# Patient Record
Sex: Female | Born: 1990 | Race: White | Hispanic: No | Marital: Single | Smoking: Former smoker
Health system: Southern US, Community
[De-identification: ages and names within clinical notes are randomized; demographics above are authoritative.]

## PROBLEM LIST (undated history)

## (undated) DIAGNOSIS — M7070 Other bursitis of hip, unspecified hip: Secondary | ICD-10-CM

## (undated) DIAGNOSIS — F419 Anxiety disorder, unspecified: Secondary | ICD-10-CM

## (undated) DIAGNOSIS — F319 Bipolar disorder, unspecified: Secondary | ICD-10-CM

## (undated) DIAGNOSIS — M549 Dorsalgia, unspecified: Secondary | ICD-10-CM

## (undated) HISTORY — DX: Dorsalgia, unspecified: M54.9

## (undated) HISTORY — PX: DENTAL SURGERY: SHX609

## (undated) HISTORY — PX: TUBAL LIGATION: SHX77

## (undated) HISTORY — DX: Other bursitis of hip, unspecified hip: M70.70

---
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
## 2009-02-08 ENCOUNTER — Observation Stay: Payer: Self-pay

## 2009-02-17 ENCOUNTER — Inpatient Hospital Stay: Payer: Self-pay

## 2009-02-20 ENCOUNTER — Ambulatory Visit: Payer: Self-pay | Admitting: Pediatrics

## 2009-07-12 ENCOUNTER — Ambulatory Visit: Payer: Self-pay | Admitting: Family Medicine

## 2009-12-30 ENCOUNTER — Observation Stay: Payer: Self-pay

## 2010-02-08 ENCOUNTER — Observation Stay: Payer: Self-pay | Admitting: Obstetrics & Gynecology

## 2010-02-16 ENCOUNTER — Observation Stay: Payer: Self-pay | Admitting: Obstetrics and Gynecology

## 2010-02-16 ENCOUNTER — Inpatient Hospital Stay: Payer: Self-pay

## 2010-02-25 ENCOUNTER — Emergency Department: Payer: Self-pay | Admitting: Emergency Medicine

## 2010-08-28 ENCOUNTER — Emergency Department: Payer: Self-pay | Admitting: Emergency Medicine

## 2010-10-16 ENCOUNTER — Emergency Department: Payer: Self-pay | Admitting: Emergency Medicine

## 2010-10-17 ENCOUNTER — Emergency Department: Payer: Self-pay | Admitting: Emergency Medicine

## 2010-12-18 ENCOUNTER — Emergency Department: Payer: Self-pay | Admitting: Emergency Medicine

## 2011-04-18 ENCOUNTER — Emergency Department: Payer: Self-pay | Admitting: Emergency Medicine

## 2011-09-17 ENCOUNTER — Emergency Department: Payer: Self-pay | Admitting: Unknown Physician Specialty

## 2011-09-17 LAB — PREGNANCY, URINE: Pregnancy Test, Urine: NEGATIVE m[IU]/mL

## 2011-11-29 ENCOUNTER — Emergency Department: Payer: Self-pay | Admitting: Emergency Medicine

## 2011-11-29 LAB — CBC
HCT: 41.6 % (ref 35.0–47.0)
MCH: 31.8 pg (ref 26.0–34.0)
MCV: 92 fL (ref 80–100)
Platelet: 236 10*3/uL (ref 150–440)
RBC: 4.55 10*6/uL (ref 3.80–5.20)
RDW: 12.3 % (ref 11.5–14.5)

## 2011-11-29 LAB — COMPREHENSIVE METABOLIC PANEL
Albumin: 4.2 g/dL (ref 3.4–5.0)
Alkaline Phosphatase: 75 U/L (ref 50–136)
BUN: 11 mg/dL (ref 7–18)
Calcium, Total: 9 mg/dL (ref 8.5–10.1)
Chloride: 105 mmol/L (ref 98–107)
Glucose: 82 mg/dL (ref 65–99)
SGOT(AST): 21 U/L (ref 15–37)
SGPT (ALT): 18 U/L (ref 12–78)
Total Protein: 8.3 g/dL — ABNORMAL HIGH (ref 6.4–8.2)

## 2011-11-29 LAB — URINALYSIS, COMPLETE
Bilirubin,UR: NEGATIVE
Ketone: NEGATIVE
Nitrite: NEGATIVE
Protein: 30
Specific Gravity: 1.028 (ref 1.003–1.030)
WBC UR: 3 /HPF (ref 0–5)

## 2011-11-29 LAB — WET PREP, GENITAL

## 2011-11-29 LAB — LIPASE, BLOOD: Lipase: 107 U/L (ref 73–393)

## 2011-12-25 ENCOUNTER — Emergency Department: Payer: Self-pay | Admitting: Emergency Medicine

## 2012-01-08 ENCOUNTER — Emergency Department: Payer: Self-pay | Admitting: Emergency Medicine

## 2012-01-08 LAB — URINALYSIS, COMPLETE
Bacteria: NONE SEEN
Bilirubin,UR: NEGATIVE
Glucose,UR: NEGATIVE mg/dL (ref 0–75)
Hyaline Cast: 7
Ketone: NEGATIVE
Ph: 6 (ref 4.5–8.0)
Protein: 100
RBC,UR: 483 /HPF (ref 0–5)
Specific Gravity: 1.025 (ref 1.003–1.030)
WBC UR: 1548 /HPF (ref 0–5)

## 2012-01-20 ENCOUNTER — Emergency Department: Payer: Self-pay | Admitting: Emergency Medicine

## 2012-02-24 ENCOUNTER — Emergency Department: Payer: Self-pay | Admitting: Emergency Medicine

## 2012-02-24 LAB — CBC
HCT: 40.5 % (ref 35.0–47.0)
HGB: 13.9 g/dL (ref 12.0–16.0)
MCHC: 34.4 g/dL (ref 32.0–36.0)
Platelet: 225 10*3/uL (ref 150–440)
RBC: 4.49 10*6/uL (ref 3.80–5.20)
WBC: 7.2 10*3/uL (ref 3.6–11.0)

## 2012-02-24 LAB — BASIC METABOLIC PANEL
Anion Gap: 5 — ABNORMAL LOW (ref 7–16)
BUN: 5 mg/dL — ABNORMAL LOW (ref 7–18)
Chloride: 107 mmol/L (ref 98–107)
Co2: 25 mmol/L (ref 21–32)
EGFR (Non-African Amer.): >60
Glucose: 83 mg/dL (ref 65–99)
Osmolality: 270 (ref 275–301)
Potassium: 4.2 mmol/L (ref 3.5–5.1)
Sodium: 137 mmol/L (ref 136–145)

## 2012-05-31 ENCOUNTER — Emergency Department: Payer: Self-pay | Admitting: Emergency Medicine

## 2012-05-31 LAB — COMPREHENSIVE METABOLIC PANEL
BUN: 8 mg/dL (ref 7–18)
Bilirubin,Total: 0.2 mg/dL (ref 0.2–1.0)
Calcium, Total: 8.9 mg/dL (ref 8.5–10.1)
Co2: 26 mmol/L (ref 21–32)
EGFR (African American): >60
EGFR (Non-African Amer.): >60
Glucose: 88 mg/dL (ref 65–99)
Osmolality: 272 (ref 275–301)
SGPT (ALT): 18 U/L (ref 12–78)
Total Protein: 8.2 g/dL (ref 6.4–8.2)

## 2012-05-31 LAB — CBC
HCT: 40.9 % (ref 35.0–47.0)
HGB: 13.8 g/dL (ref 12.0–16.0)
MCV: 91 fL (ref 80–100)
Platelet: 205 10*3/uL (ref 150–440)
RBC: 4.5 10*6/uL (ref 3.80–5.20)
WBC: 6.9 10*3/uL (ref 3.6–11.0)

## 2012-05-31 LAB — URINALYSIS, COMPLETE
Bilirubin,UR: NEGATIVE
Blood: NEGATIVE
Glucose,UR: NEGATIVE mg/dL (ref 0–75)
Ketone: NEGATIVE
Ph: 6 (ref 4.5–8.0)
RBC,UR: 2 /HPF (ref 0–5)
Squamous Epithelial: 6
WBC UR: 3 /HPF (ref 0–5)

## 2012-05-31 LAB — PREGNANCY, URINE: Pregnancy Test, Urine: NEGATIVE m[IU]/mL

## 2012-08-08 ENCOUNTER — Emergency Department: Payer: Self-pay | Admitting: Emergency Medicine

## 2012-08-08 LAB — URINALYSIS, COMPLETE
Glucose,UR: NEGATIVE mg/dL (ref 0–75)
Ketone: NEGATIVE
Nitrite: NEGATIVE
Protein: 30
RBC,UR: 15 /HPF (ref 0–5)
Squamous Epithelial: 2
WBC UR: 237 /HPF (ref 0–5)

## 2012-08-08 LAB — BASIC METABOLIC PANEL
Anion Gap: 4 — ABNORMAL LOW (ref 7–16)
Chloride: 105 mmol/L (ref 98–107)
Co2: 28 mmol/L (ref 21–32)
EGFR (Non-African Amer.): >60
Potassium: 3.9 mmol/L (ref 3.5–5.1)

## 2012-08-08 LAB — CBC
HCT: 40.7 % (ref 35.0–47.0)
HGB: 13.7 g/dL (ref 12.0–16.0)
MCH: 30.7 pg (ref 26.0–34.0)
MCHC: 33.7 g/dL (ref 32.0–36.0)
MCV: 91 fL (ref 80–100)
Platelet: 259 10*3/uL (ref 150–440)
RBC: 4.48 10*6/uL (ref 3.80–5.20)
RDW: 13.1 % (ref 11.5–14.5)

## 2012-08-11 LAB — URINE CULTURE

## 2012-09-03 ENCOUNTER — Ambulatory Visit: Payer: Self-pay | Admitting: Family Medicine

## 2012-11-18 ENCOUNTER — Emergency Department: Payer: Self-pay | Admitting: Emergency Medicine

## 2012-11-18 LAB — CBC
HGB: 13.7 g/dL (ref 12.0–16.0)
MCH: 32 pg (ref 26.0–34.0)
Platelet: 245 10*3/uL (ref 150–440)
RBC: 4.29 10*6/uL (ref 3.80–5.20)
RDW: 12.7 % (ref 11.5–14.5)
WBC: 8.8 10*3/uL (ref 3.6–11.0)

## 2012-11-18 LAB — BASIC METABOLIC PANEL
BUN: 11 mg/dL (ref 7–18)
Calcium, Total: 9 mg/dL (ref 8.5–10.1)
Chloride: 104 mmol/L (ref 98–107)
Creatinine: 0.85 mg/dL (ref 0.60–1.30)
Sodium: 136 mmol/L (ref 136–145)

## 2012-11-18 LAB — TROPONIN I: Troponin-I: <0.02 ng/mL

## 2012-11-20 ENCOUNTER — Inpatient Hospital Stay: Payer: Self-pay | Admitting: Psychiatry

## 2012-11-20 LAB — COMPREHENSIVE METABOLIC PANEL
Albumin: 4.1 g/dL (ref 3.4–5.0)
Alkaline Phosphatase: 53 U/L (ref 50–136)
Anion Gap: 4 — ABNORMAL LOW (ref 7–16)
BUN: 8 mg/dL (ref 7–18)
Bilirubin,Total: 0.4 mg/dL (ref 0.2–1.0)
Calcium, Total: 8.8 mg/dL (ref 8.5–10.1)
Chloride: 107 mmol/L (ref 98–107)
Co2: 29 mmol/L (ref 21–32)
Creatinine: 0.77 mg/dL (ref 0.60–1.30)
EGFR (African American): >60
EGFR (Non-African Amer.): >60
Glucose: 80 mg/dL (ref 65–99)
Osmolality: 277 (ref 275–301)
Potassium: 4 mmol/L (ref 3.5–5.1)
SGOT(AST): 20 U/L (ref 15–37)
SGPT (ALT): 18 U/L (ref 12–78)
Sodium: 140 mmol/L (ref 136–145)
Total Protein: 7.7 g/dL (ref 6.4–8.2)

## 2012-11-20 LAB — ETHANOL: Ethanol: <3 mg/dL

## 2012-11-20 LAB — URINALYSIS, COMPLETE
Bilirubin,UR: NEGATIVE
Glucose,UR: NEGATIVE mg/dL (ref 0–75)
Ketone: NEGATIVE
Ph: 7 (ref 4.5–8.0)
Specific Gravity: 1.015 (ref 1.003–1.030)
Squamous Epithelial: 8
WBC UR: 2 /HPF (ref 0–5)

## 2012-11-20 LAB — DRUG SCREEN, URINE
Amphetamines, Ur Screen: NEGATIVE (ref ?–1000)
Cannabinoid 50 Ng, Ur ~~LOC~~: NEGATIVE (ref ?–50)
Cocaine Metabolite,Ur ~~LOC~~: NEGATIVE (ref ?–300)
MDMA (Ecstasy)Ur Screen: POSITIVE (ref ?–500)
Methadone, Ur Screen: NEGATIVE (ref ?–300)
Phencyclidine (PCP) Ur S: NEGATIVE (ref ?–25)
Tricyclic, Ur Screen: NEGATIVE (ref ?–1000)

## 2012-11-20 LAB — TSH: Thyroid Stimulating Horm: 0.74 u[IU]/mL

## 2012-11-20 LAB — CBC
HCT: 39.1 % (ref 35.0–47.0)
HGB: 13.5 g/dL (ref 12.0–16.0)
MCH: 31.3 pg (ref 26.0–34.0)
MCHC: 34.6 g/dL (ref 32.0–36.0)
Platelet: 244 10*3/uL (ref 150–440)
RBC: 4.33 10*6/uL (ref 3.80–5.20)
RDW: 12.7 % (ref 11.5–14.5)

## 2013-09-08 ENCOUNTER — Emergency Department: Payer: Self-pay | Admitting: Emergency Medicine

## 2013-09-08 LAB — TROPONIN I: Troponin-I: <0.02 ng/mL

## 2013-09-08 LAB — CBC
HCT: 41.6 % (ref 35.0–47.0)
HGB: 13.7 g/dL (ref 12.0–16.0)
MCH: 30.8 pg (ref 26.0–34.0)
MCHC: 32.9 g/dL (ref 32.0–36.0)
MCV: 93 fL (ref 80–100)
Platelet: 271 10*3/uL (ref 150–440)
RBC: 4.45 10*6/uL (ref 3.80–5.20)
RDW: 12.6 % (ref 11.5–14.5)
WBC: 4.3 10*3/uL (ref 3.6–11.0)

## 2013-09-08 LAB — BASIC METABOLIC PANEL
ANION GAP: 5 — AB (ref 7–16)
BUN: 7 mg/dL (ref 7–18)
CALCIUM: 8.8 mg/dL (ref 8.5–10.1)
CHLORIDE: 106 mmol/L (ref 98–107)
CREATININE: 0.75 mg/dL (ref 0.60–1.30)
Co2: 26 mmol/L (ref 21–32)
EGFR (African American): >60
EGFR (Non-African Amer.): >60
GLUCOSE: 79 mg/dL (ref 65–99)
Osmolality: 271 (ref 275–301)
POTASSIUM: 3.7 mmol/L (ref 3.5–5.1)
SODIUM: 137 mmol/L (ref 136–145)

## 2013-11-22 ENCOUNTER — Emergency Department: Payer: Self-pay | Admitting: Emergency Medicine

## 2013-11-22 LAB — URINALYSIS, COMPLETE
BACTERIA: NONE SEEN
BILIRUBIN, UR: NEGATIVE
Blood: NEGATIVE
Glucose,UR: NEGATIVE mg/dL (ref 0–75)
KETONE: NEGATIVE
LEUKOCYTE ESTERASE: NEGATIVE
Nitrite: NEGATIVE
Ph: 7 (ref 4.5–8.0)
Protein: NEGATIVE
RBC,UR: 1 /HPF (ref 0–5)
SPECIFIC GRAVITY: 1.017 (ref 1.003–1.030)
WBC UR: 2 /HPF (ref 0–5)

## 2013-11-22 LAB — COMPREHENSIVE METABOLIC PANEL
ALK PHOS: 33 U/L — AB
ALT: 13 U/L — AB
ANION GAP: 12 (ref 7–16)
Albumin: 3.8 g/dL (ref 3.4–5.0)
BUN: 6 mg/dL — ABNORMAL LOW (ref 7–18)
Bilirubin,Total: 0.3 mg/dL (ref 0.2–1.0)
CALCIUM: 8.8 mg/dL (ref 8.5–10.1)
Chloride: 107 mmol/L (ref 98–107)
Co2: 22 mmol/L (ref 21–32)
Creatinine: 0.61 mg/dL (ref 0.60–1.30)
Glucose: 62 mg/dL — ABNORMAL LOW (ref 65–99)
Osmolality: 277 (ref 275–301)
Potassium: 3.5 mmol/L (ref 3.5–5.1)
SGOT(AST): 17 U/L (ref 15–37)
SODIUM: 141 mmol/L (ref 136–145)
TOTAL PROTEIN: 7.3 g/dL (ref 6.4–8.2)

## 2013-11-22 LAB — CBC WITH DIFFERENTIAL/PLATELET
BASOS ABS: 0 10*3/uL (ref 0.0–0.1)
Basophil %: 0.3 %
Eosinophil #: 0.1 10*3/uL (ref 0.0–0.7)
Eosinophil %: 0.7 %
HCT: 40.2 % (ref 35.0–47.0)
HGB: 13.5 g/dL (ref 12.0–16.0)
Lymphocyte #: 1.9 10*3/uL (ref 1.0–3.6)
Lymphocyte %: 23.2 %
MCH: 31.9 pg (ref 26.0–34.0)
MCHC: 33.7 g/dL (ref 32.0–36.0)
MCV: 95 fL (ref 80–100)
Monocyte #: 0.7 x10 3/mm (ref 0.2–0.9)
Monocyte %: 9.1 %
NEUTROS PCT: 66.7 %
Neutrophil #: 5.4 10*3/uL (ref 1.4–6.5)
Platelet: 233 10*3/uL (ref 150–440)
RBC: 4.23 10*6/uL (ref 3.80–5.20)
RDW: 13.1 % (ref 11.5–14.5)
WBC: 8.1 10*3/uL (ref 3.6–11.0)

## 2013-11-22 LAB — HCG, QUANTITATIVE, PREGNANCY: Beta Hcg, Quant.: 82680 m[IU]/mL — ABNORMAL HIGH

## 2013-11-22 LAB — LIPASE, BLOOD: Lipase: 107 U/L (ref 73–393)

## 2013-11-24 IMAGING — CR DG ABDOMEN 2V
1 series · 3 of 3 positions shown · non-contrast
Comparison: none

REASON FOR EXAM: abdominal distension and pain
COMMENTS:

PROCEDURE:     DXR - DXR ABDOMEN 2 V FLAT AND ERECT  - May 31, 2012  [DATE]
RESULT:     Comparison: None.

[Series 1: w abdomen upright · 0.14mm/px · 3 of 3 slices shown]
[im 1/3]
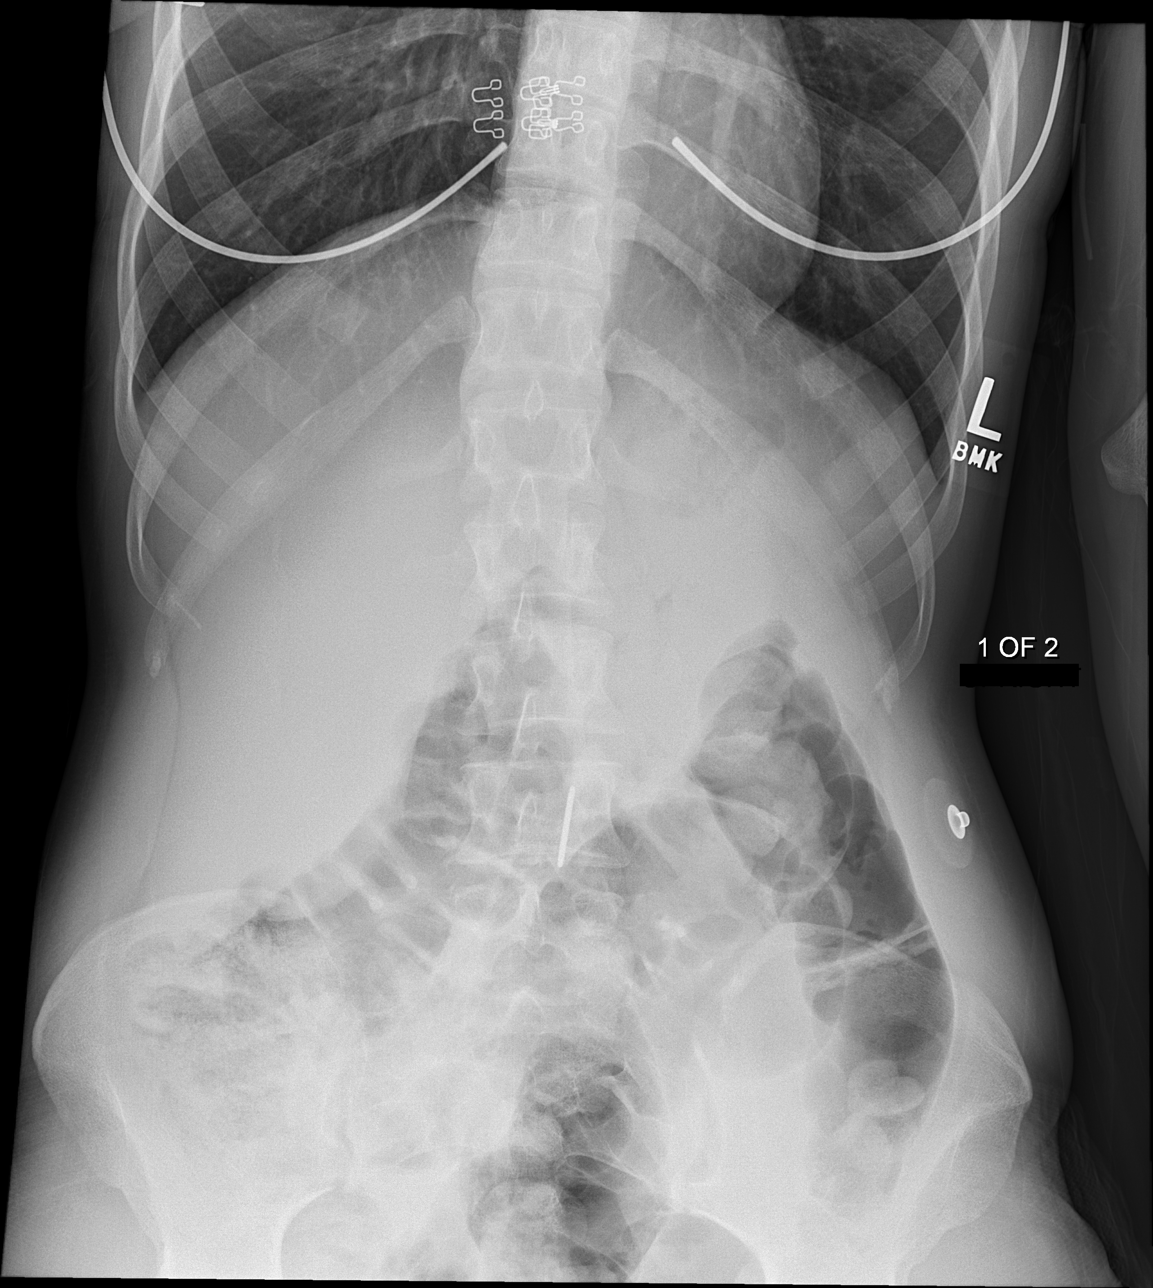
[im 2/3]
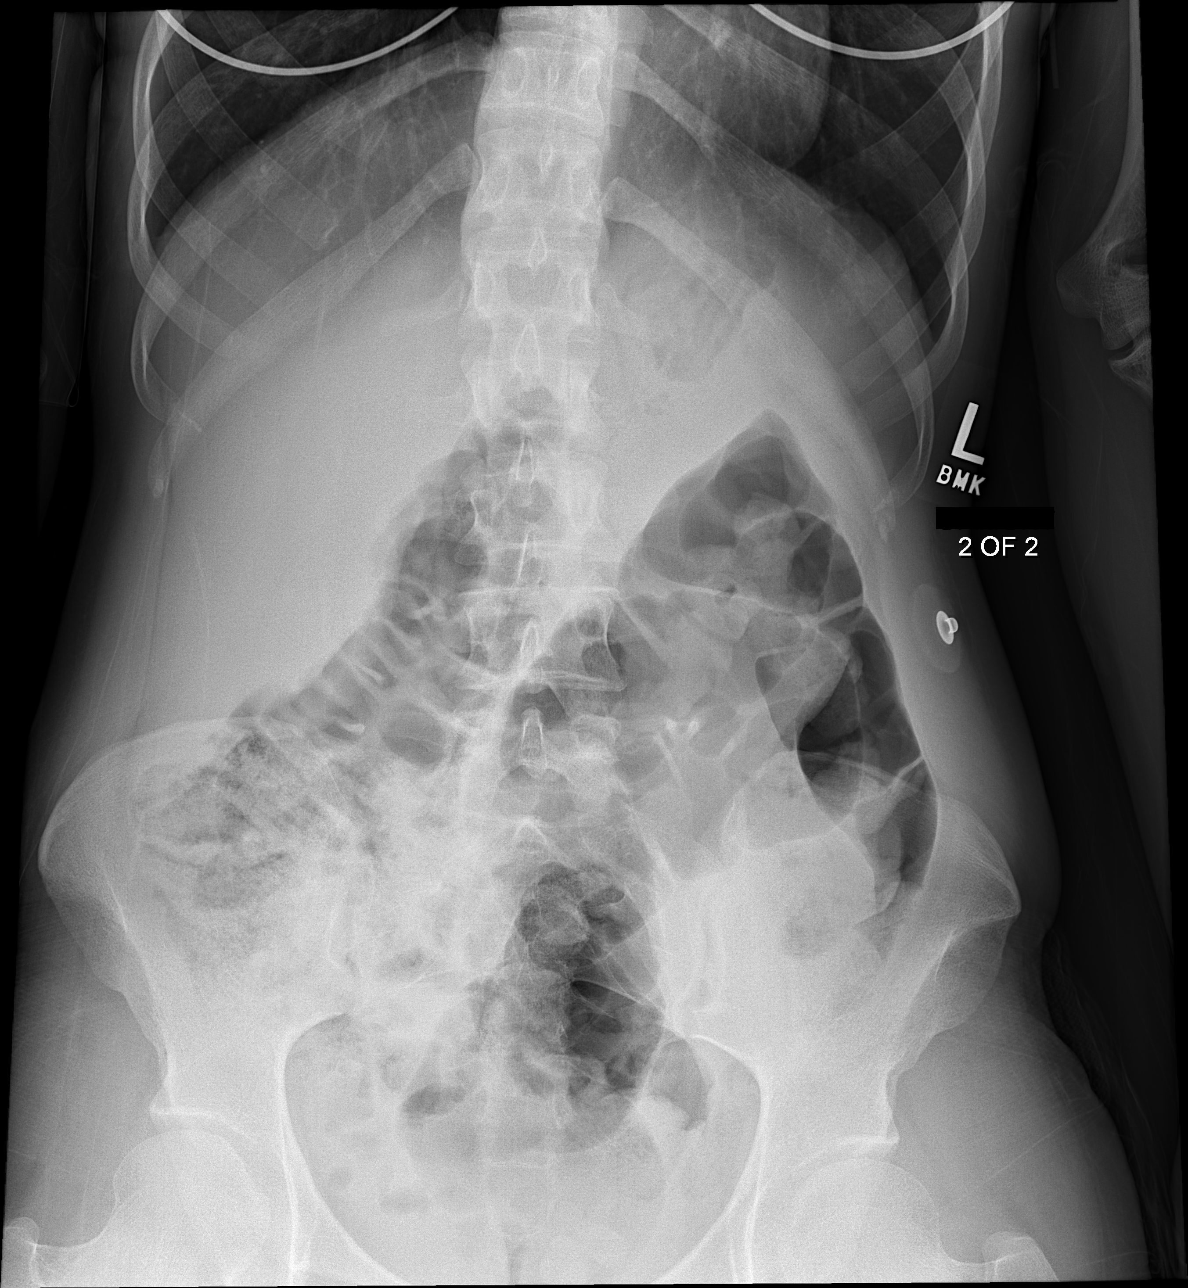
[im 3/3]
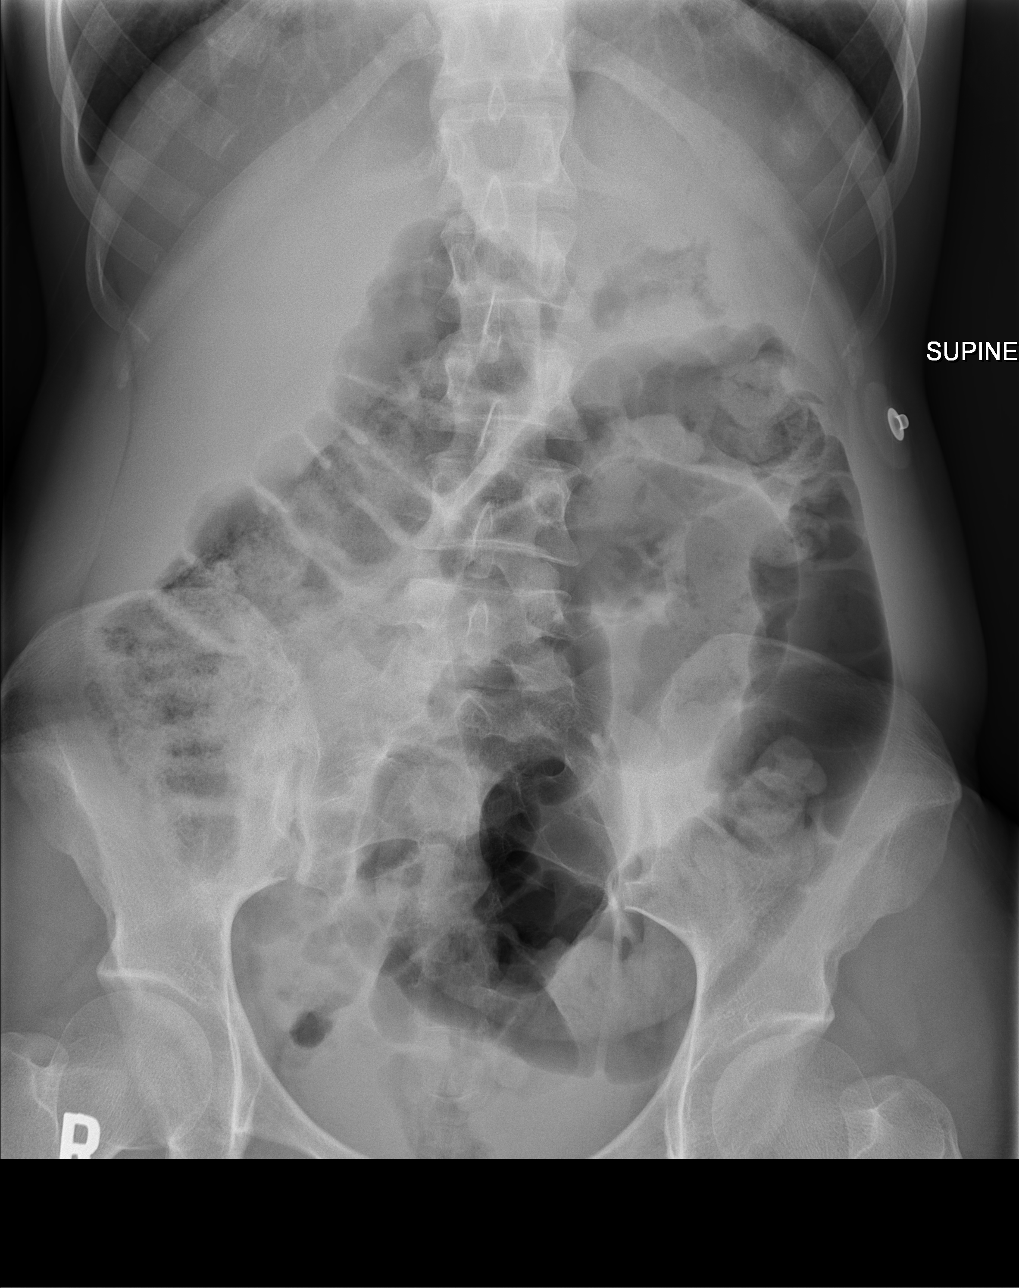

[3 of 3 positions shown; findings below may reference images not displayed]

FINDINGS: The lung bases are clear. No free intraperitoneal air. Air seen within
mildly dilated colon. There is stool in the ascending colon. Air seen within
nondilated small bowel.
IMPRESSION: Mild colonic distention may be secondary to colonic ileus. Distal bowel
obstruction is not excluded.

[REDACTED]

## 2013-12-21 ENCOUNTER — Emergency Department: Payer: Self-pay | Admitting: Emergency Medicine

## 2014-01-23 ENCOUNTER — Ambulatory Visit: Payer: Self-pay | Admitting: Family Medicine

## 2014-02-03 ENCOUNTER — Observation Stay: Payer: Self-pay

## 2014-02-03 LAB — URINALYSIS, COMPLETE
BLOOD: NEGATIVE
Bacteria: NONE SEEN
Bilirubin,UR: NEGATIVE
Glucose,UR: NEGATIVE mg/dL (ref 0–75)
Ketone: NEGATIVE
LEUKOCYTE ESTERASE: NEGATIVE
Nitrite: NEGATIVE
Ph: 7 (ref 4.5–8.0)
Protein: NEGATIVE
RBC,UR: 1 /HPF (ref 0–5)
Specific Gravity: 1.015 (ref 1.003–1.030)
Squamous Epithelial: 4
WBC UR: 1 /HPF (ref 0–5)

## 2014-02-27 IMAGING — US US PELV - US TRANSVAGINAL
1 series · 14 of 25 positions shown · non-contrast
Comparison: none

REASON FOR EXAM: pelvic pain
COMMENTS:

[Series 1: us pelv - us transvaginal · 0.33mm/px · 14 of 55 slices shown]
[im 1/55]
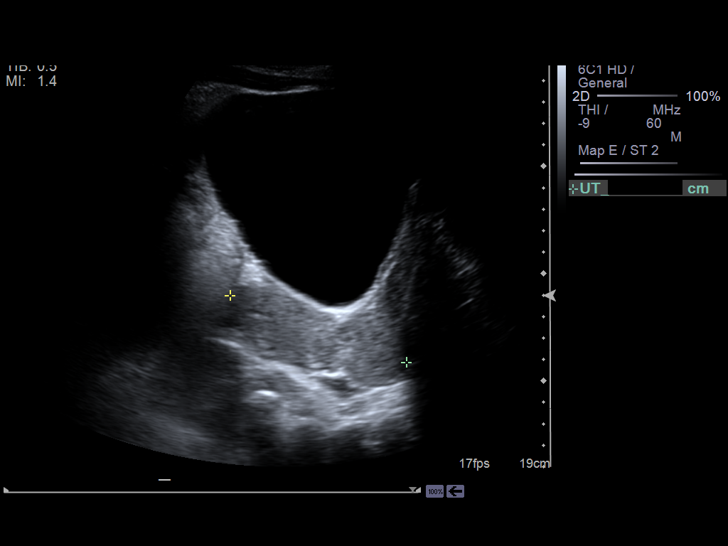
[im 5/55]
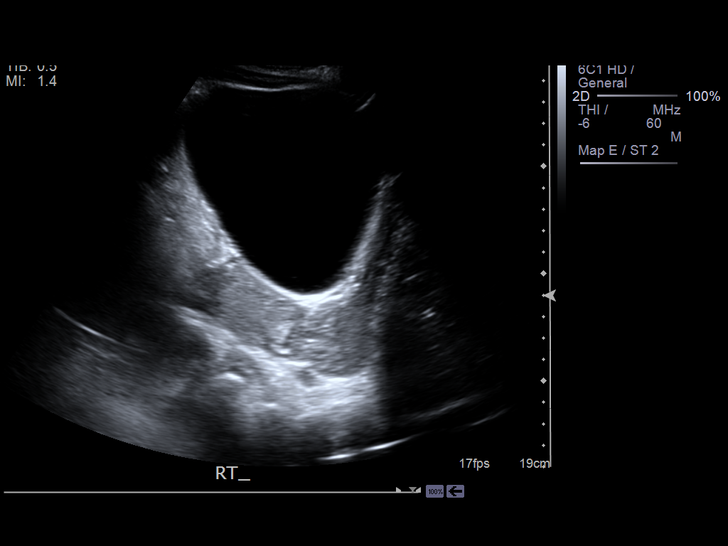
[im 10/55]
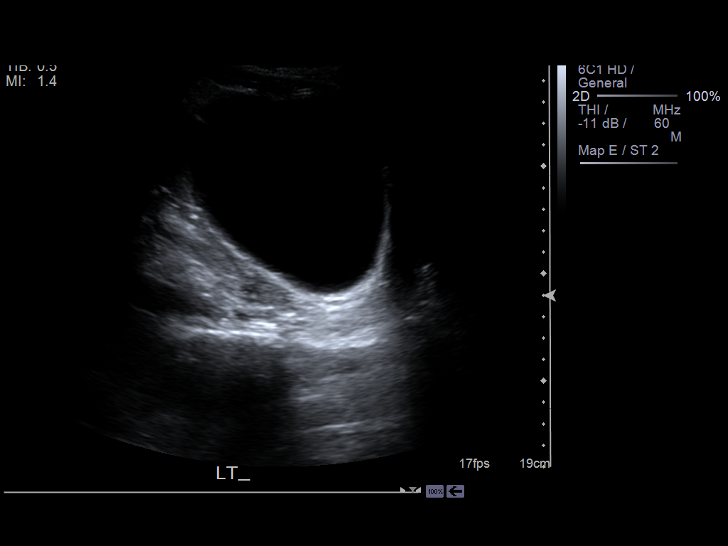
[im 14/55]
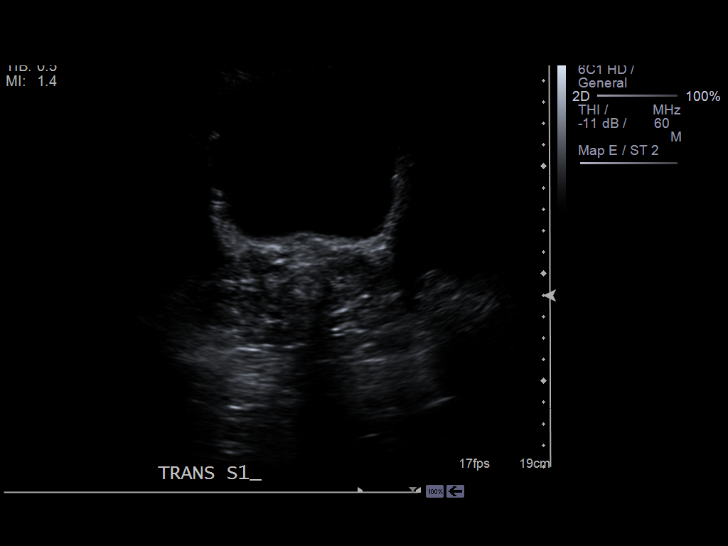
[im 19/55]
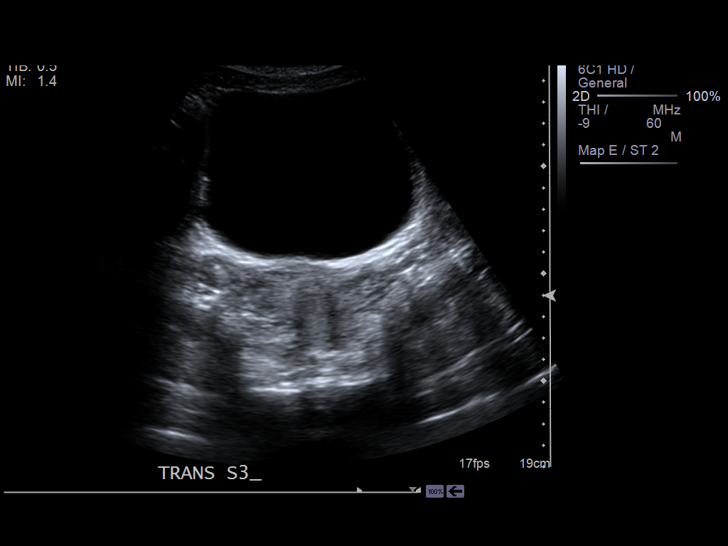
[im 21/55]
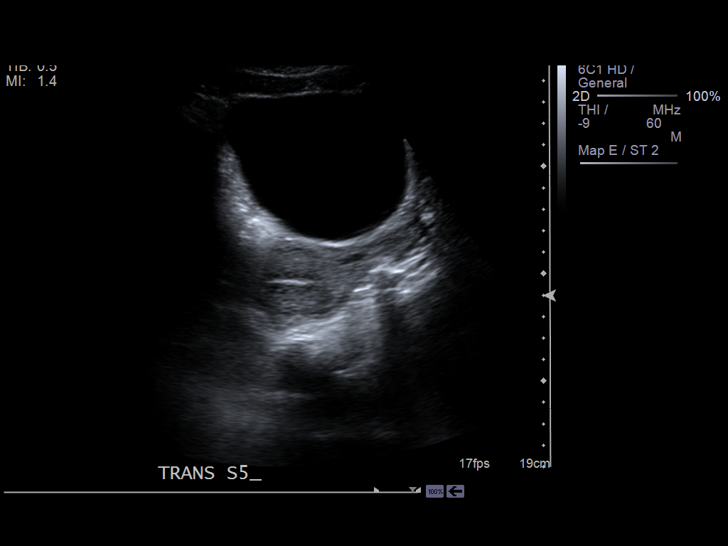
[im 25/55]
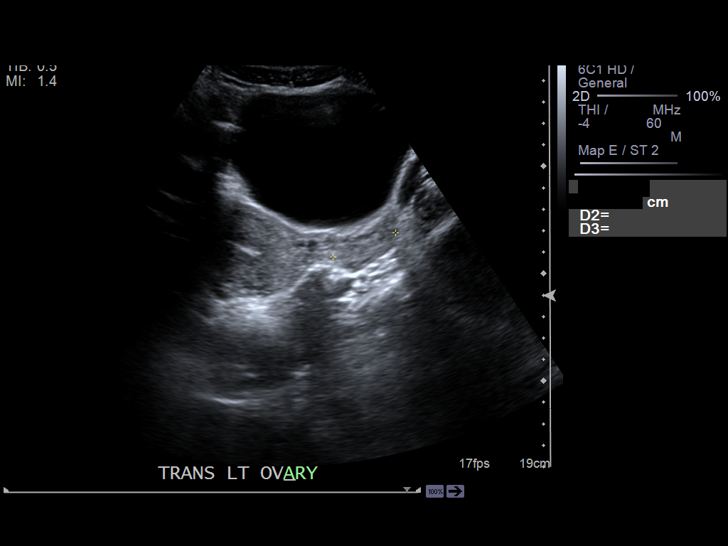
[im 30/55]
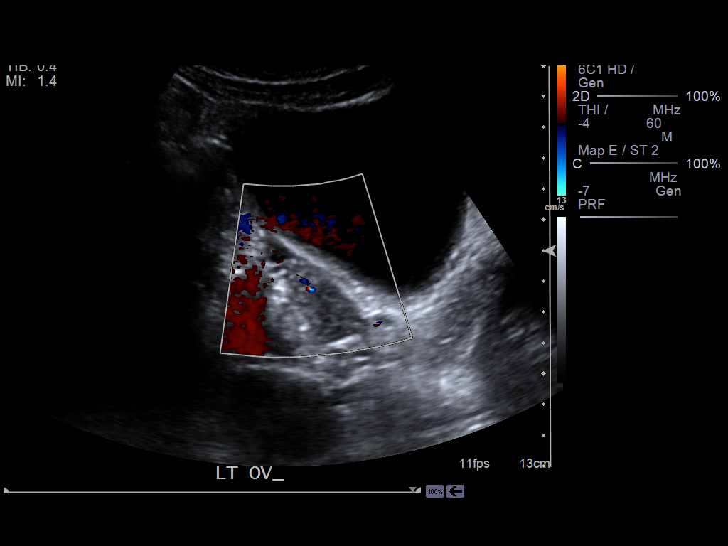
[im 34/55]
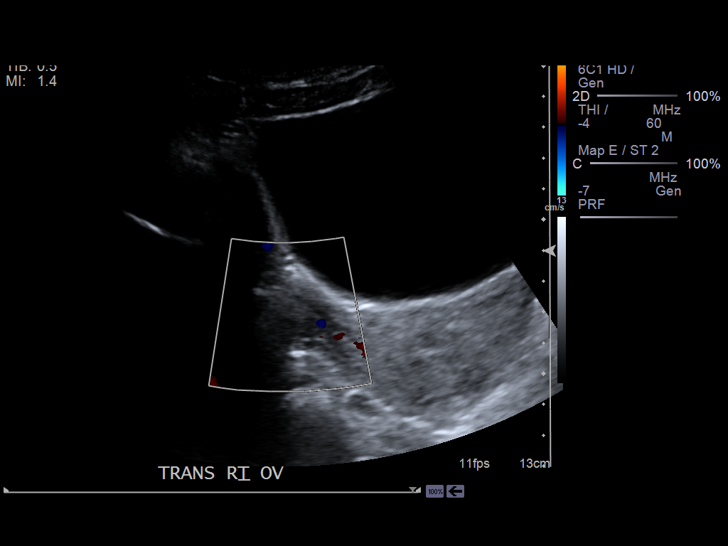
[im 37/55]
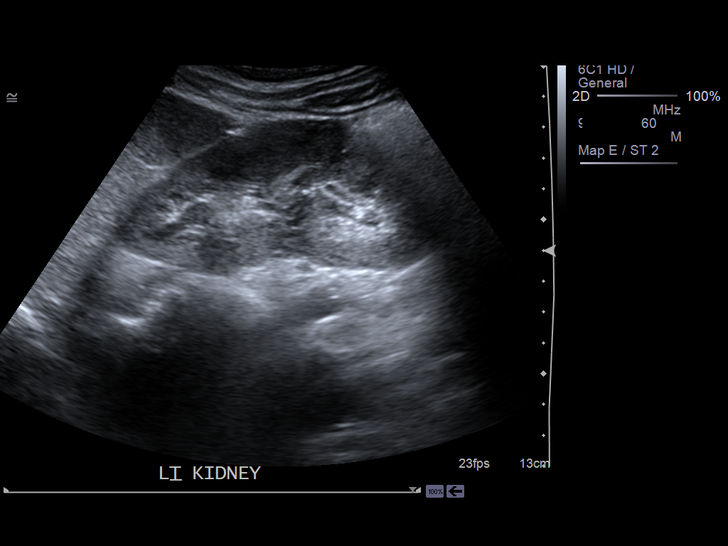
[im 41/55]
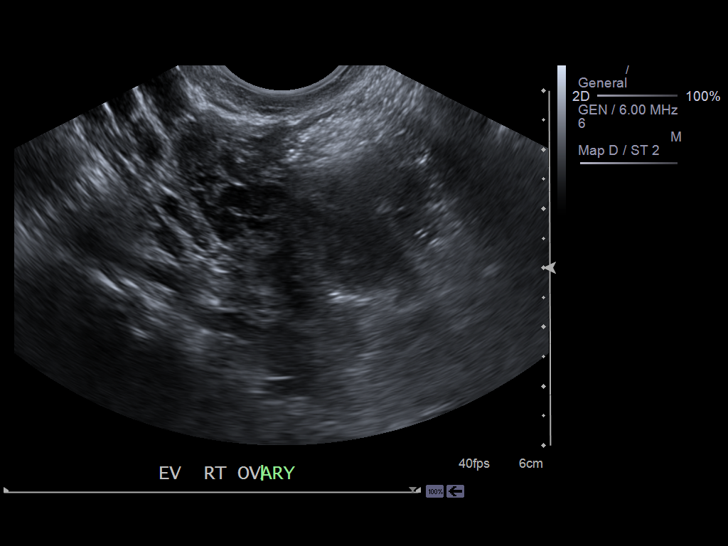
[im 46/55]
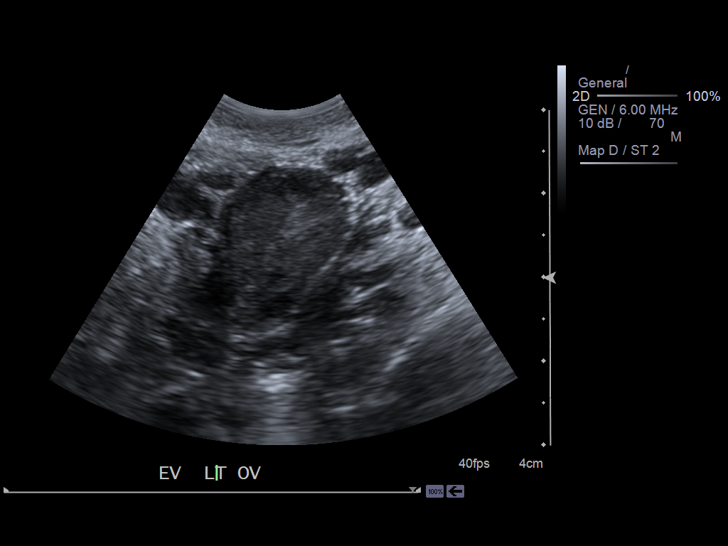
[im 50/55]
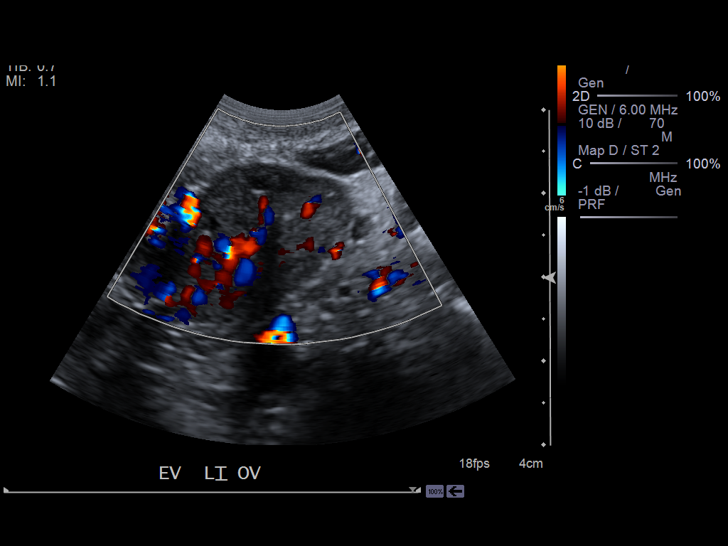
[im 55/55]
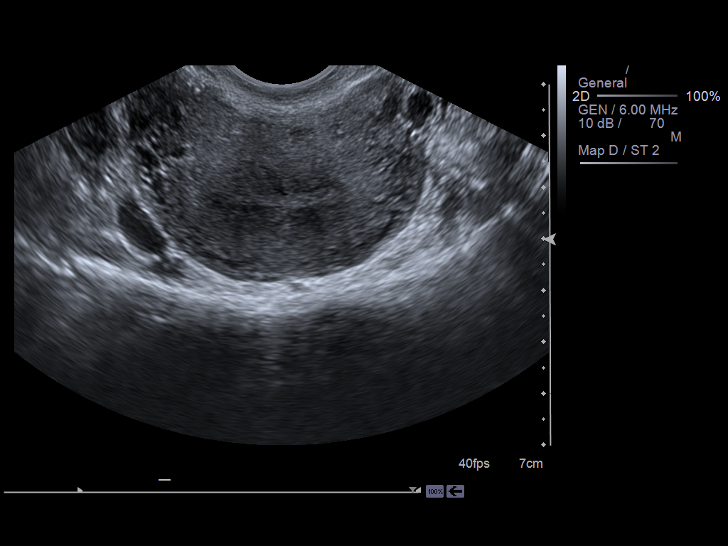

[14 of 25 positions shown; findings below may reference images not displayed]

PROCEDURE:     US  - US PELVIS EXAM W/TRANSVAGINAL  - September 03, 2012  [DATE]

RESULT:     Transabdominal and endovaginal pelvic sonogram is performed.
Uterus measures 8.79 x 5.49 x 3.95 cm with an endometrial stripe of 3.1 mm
thickness. There is a trace of free fluid in the cul-de-sac. The right ovary
measures 2.10 x 2.17 x 2.35 cm. The left ovary measures 3.13 x 2.0 x
cm. Blood flow is documented in both ovaries with color Doppler imaging. The
kidneys are grossly normal on survey images.
IMPRESSION: Normal appearing pelvic sonogram.

[REDACTED]

## 2014-02-28 ENCOUNTER — Observation Stay: Payer: Self-pay

## 2014-02-28 LAB — DRUG SCREEN, URINE
Amphetamines, Ur Screen: NEGATIVE (ref ?–1000)
Barbiturates, Ur Screen: NEGATIVE (ref ?–200)
Benzodiazepine, Ur Scrn: NEGATIVE (ref ?–200)
Cannabinoid 50 Ng, Ur ~~LOC~~: NEGATIVE (ref ?–50)
Cocaine Metabolite,Ur ~~LOC~~: NEGATIVE (ref ?–300)
MDMA (ECSTASY) UR SCREEN: NEGATIVE (ref ?–500)
Methadone, Ur Screen: NEGATIVE (ref ?–300)
Opiate, Ur Screen: NEGATIVE (ref ?–300)
Phencyclidine (PCP) Ur S: NEGATIVE (ref ?–25)
TRICYCLIC, UR SCREEN: NEGATIVE (ref ?–1000)

## 2014-02-28 LAB — URINALYSIS, COMPLETE
Bilirubin,UR: NEGATIVE
Blood: NEGATIVE
Glucose,UR: NEGATIVE mg/dL (ref 0–75)
Ketone: NEGATIVE
Nitrite: NEGATIVE
PH: 6 (ref 4.5–8.0)
Protein: NEGATIVE
RBC,UR: 2 /HPF (ref 0–5)
SPECIFIC GRAVITY: 1.024 (ref 1.003–1.030)
WBC UR: 4 /HPF (ref 0–5)

## 2014-04-29 ENCOUNTER — Observation Stay: Payer: Self-pay

## 2014-04-29 LAB — URINALYSIS, COMPLETE
BILIRUBIN, UR: NEGATIVE
BLOOD: NEGATIVE
Bacteria: NONE SEEN
GLUCOSE, UR: NEGATIVE mg/dL (ref 0–75)
KETONE: NEGATIVE
Nitrite: NEGATIVE
PROTEIN: NEGATIVE
Ph: 7 (ref 4.5–8.0)
Specific Gravity: 1.018 (ref 1.003–1.030)
Squamous Epithelial: 58
WBC UR: 9 /HPF (ref 0–5)

## 2014-05-15 ENCOUNTER — Observation Stay: Payer: Self-pay | Admitting: Obstetrics & Gynecology

## 2014-06-15 ENCOUNTER — Observation Stay: Payer: Self-pay | Admitting: Obstetrics & Gynecology

## 2014-06-16 ENCOUNTER — Inpatient Hospital Stay: Payer: Self-pay
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
## 2014-07-24 NOTE — Discharge Summary (Signed)
PATIENT NAME:  Vicki Black, Vicki Black MR#:  [id number] DATE OF BIRTH:  12-16-1990  DATE OF ADMISSION:  11/20/2012 DATE OF DISCHARGE:  11/25/2012  HOSPITAL COURSE: See dictated history and physical for details of admission. A 24 year old woman with a history of substance abuse, presented to the hospital complaining of out-of-control anger with homicidal ideation. In the hospital, she was initially very fatigued and withdrawn, but after a day or so she became more interactive on the ward. She did not engage in any dangerous or threatening or hostile behavior. She denied suicidal ideation. By the time she woke up and started moving around, the patient completely denied any homicidal ideation. She said that she did not even remember it and probably just said that because she was intoxicated. Her affect improved. She was treated with citalopram for mood instability and tolerated it well. She was counseled about the importance of staying off of alcohol. The patient understood and agreed to follow up with treatment in the community. At the time of discharge, she was calm, mentally stable. No sign of withdrawal.   DISCHARGE MEDICATIONS: Citalopram 20 mg once a day, hydroxyzine 50 mg every 6 hours as needed for anxiety.   LABORATORY RESULTS: Admission labs included TSH normal at 0.7. Alcohol undetected. Chemistry panel normal. Drug screen positive for benzodiazepines and MDMA. The CBC was normal. Urinalysis unremarkable.   MENTAL STATUS EXAMINATION AT DISCHARGE:  Casually dressed, neatly groomed woman, looks her stated age. Cooperative with the interview. Good eye contact. Normal psychomotor activity. Speech is normal in rate, tone and volume. Affect: euthymic, reactive, appropriate. Mood stated as being fine. Thoughts are lucid. No evidence of loosening of associations or delusions. Denies auditory or visual hallucinations. Denies suicidal or homicidal ideation. Shows improved insight and judgment. Normal intelligence.    DISPOSITION: The patient is discharged home and will follow up with [facility].   DIAGNOSIS; PRINCIPAL AND PRIMARY:  AXIS I: Polysubstance dependence.   SECONDARY DIAGNOSES: AXIS I: Substance-induced mood disorder.   AXIS II: Antisocial traits.   AXIS III: No diagnosis.   AXIS IV: Severe from substance abuse and minimal resources.   AXIS V: Functioning at time of discharge, 55.   ____________________________ Audery AmelJohn T. Kayley Zeiders, MD jtc:nts D: 12/06/2012 23:22:48 ET T: 12/07/2012 03:35:06 ET JOB#: [id number]  cc: Audery AmelJohn T. Bianka Liberati, MD, <Dictator> Audery AmelJOHN T Timmya Blazier MD ELECTRONICALLY SIGNED 12/09/2012 17:16

## 2014-07-24 NOTE — Consult Note (Signed)
Brief Consult Note: Diagnosis: alcohol dependence.   Patient was seen by consultant.   Recommend further assessment or treatment.   Orders entered.   Comments: Psychiatry: Patient seen. [facility] and [facility] and homicidal ideation/mood instability. Full note and orders done.  Electronic Signatures: Audery Amellapacs, Etha Stambaugh T (MD)  (Signed 20-Aug-14 16:46)  Authored: Brief Consult Note   Last Updated: 20-Aug-14 16:46 by Audery Amellapacs, Kirra Verga T (MD)

## 2014-07-27 LAB — SURGICAL PATHOLOGY

## 2014-08-02 NOTE — Op Note (Signed)
PATIENT NAME:  Vicki NeighborsCRANE, Glendene N MR#:  [id number] DATE OF BIRTH:  09-Feb-1991  DATE OF PROCEDURE:  06/17/2014  ATTENDING:  [place] Bingharlie Shizuko Wojdyla, MD  PREOPERATIVE DIAGNOSES:  1. Postpartum day one.  2. Desire for permanent sterilization.   POSTOPERATIVE DIAGNOSES:  1. Postpartum day one.  2. Desire for permanent sterilization.   PROCEDURE: Minilaparotomy and bilateral tubal ligation via [place] method.   SURGEON: [place] Bingharlie Magdeline Prange, MD  ASSISTANT: None.   ANESTHESIA: General and local.   INTRAVENOUS FLUIDS: 600 mL crystalloid.   ESTIMATED BLOOD LOSS: 10 mL.   URINE OUTPUT: No indwelling catheter or in and out catheterization was done.   ANTIBIOTICS: Not indicated.   COMPLICATIONS: None.   SPECIMENS: Portions of bilateral tubes to pathology.   DISPOSITION: Stable to PACU.   FINDINGS: normal appearing and on palpation uterine fundus, no intra-abdominal adhesions noted in the operative field. Normal tubes and ovaries bilaterally.   INDICATIONS:   After a long discussion with the patient including discussion of LARC and vasectomy as well as discussion with patient regarding risk of ectopic and failure and regret, the  patient decided to proceed with postpartum bilateral tubal ligation. Sterilizations were approved and confirmed prior to surgery.   DESCRIPTION OF PROCEDURE: The patient was taken to the Operating Room where the anesthesia was administered. She was then prepped and draped in normal sterile fashion in dorsal supine position. Next, the patient was examined and the fundus was noted to be approximately 3 cm below the umbilicus. This area was then injected with a local anesthesia and a 3 cm horizontal incision was then made with the scalpel and carried down to the underlying fascia with the Bovie. The Bovie was then nicked and dissected off the rectus muscles with the Mayo scissors. The muscles separated in the midline, and the peritoneum entered bluntly. Next, no adhesions were  noted and the laparotomy sponges were then tagged and then packed inside the abdomen to pack away the bowel and the abdomen was then explored with no adhesions noted and attention was then turned to the left adnexa.   The patient was then airplaned to the right side, and the left adnexa were then identified and the tubes were then traced out to the fimbria and the ovaries which were normal. Next, a midportion of the tube was then grasped with a Babcock clamp, an avascular portion of the mesosalpinx identified and cauterized and opened with the Bovie. Next, using 1-0 Vicryl, a stitch was then placed at the proximal and distal portions of the tube this was then reinforced with another stitch below each of these segments and using the Metzenbaum scissors, a portion of the tubal segment was then removed and was then handed  to pathology. The site was then inspected and no bleeding was noted. The same  procedure was then done on the right side, with no complications.  Attention was then turned to the other side and the site was then  reinspected and no bleeding was noted at this time. Next, the  laparotomy sponges were then removed from the patient's abdomen and the peritoneum closed with a 2-0 plain gut in a running fashion and  fascia  then closed with 0 Vicryl in a running fashion.  The subcutaneous tissue was then closed with 2 layers of 2-0 plain gut and the skin incision was then closed with 4-0 Vicryl in a subcuticular fashion. Sponge, lap, needle, and instrument counts were correct x 2, and the patient was taken to the  recovery room, awake, alert, breathing independently in stable condition.   ____________________________ [place] Bing, MD cp:tr D: 06/17/2014 16:19:58 ET T: 06/17/2014 16:39:14 ET JOB#: [id number]  cc: [place] Bing, MD, <Dictator> Four[place] Bing MD ELECTRONICALLY SIGNED 06/18/2014 17:28

## 2014-08-11 NOTE — H&P (Signed)
L&D Evaluation:  History Expanded:  HPI 24 year old G3 P2002 with EDC=06/13/2014 presents at 4375w2d days with c/o contractions.  She denies VB, LOF or decreased FM. [facility] with a 7wk1d ultrasound confirming dates(-4days), HSV II, and chronic LLQ pain.   Presents with back pain   Patient's Medical History HSV II   Patient's Surgical History Wisdom teeth extraction   Medications Pre Natal Vitamins   Allergies Sulfa   Social History drugs  + history for MDMA, crack cocaine, and benzo's   Family History Non-Contributory   ROS:  ROS see HPI   Exam:  Vital Signs stable   General no apparent distress   Mental Status clear   Abdomen gravid, non-tender   Estimated Fetal Weight Average for gestational age   Back no CVAT   Edema no edema   Pelvic other, 2 cm - changed to 3 cm with no further progress   Mebranes Intact   FHT normal rate with no decels, category 1 tracing, baseline 135, mod variability, + accels, no decels   FHT Description Tachycardia   Ucx regular, q 2-3 min   Impression:  Impression IUP at 5475w2d in early labor   Plan:  Plan discharge   Comments Reviewed expectant management until 41 wks or with medical indication.  IOL scheduled for 3/20 am.  Pt to f/u at office tomorrow for scheduled US and NST.   Follow Up Appointment already scheduled. 3/15   Electronic Signatures: Vella KohlerBrothers, Renelle Stegenga K (CNM)  (Signed 14-Mar-16 23:02)  Authored: L&D Evaluation   Last Updated: 14-Mar-16 23:02 by Vella KohlerBrothers, Kandance Yano K (CNM)

## 2014-08-11 NOTE — H&P (Signed)
L&D Evaluation:  History Expanded:  HPI 24 year old G3 P2002 with EDC=06/13/2014 presents at 35wk 6 days with c/o lower abd pain and back pain. She denies any rom or dysuria  She denies VB, LOF, dysuria, vomiting, diarrhea, or fever.  [facility] for [facility] onset [facility] with a 7wk1d ultrasound confirming dates(-4days), HSV II, and chronic LLQ pain.   Presents with back pain   Patient's Medical History HSV II   Patient's Surgical History Wisdom teeth extraction   Medications Pre Natal Vitamins   Allergies Sulfa   Social History drugs  + history for MDMA, crack cocaine, and benzo's   Family History Non-Contributory   ROS:  ROS see HPI   Exam:  Vital Signs stable   General no apparent distress   Mental Status clear   Abdomen gravid, non-tender   Estimated Fetal Weight Average for gestational age   Back no CVAT   Edema no edema   Pelvic other, FT.TH.HI.POST.VTX   Mebranes Intact   FHT normal rate with no decels, 130   Ucx irregular, and rare   Skin dry, no lesions, no rashes   Other UA negative- see results   Impression:  Impression IUP at 35 6/7 weeks with positional abd and back pain   Plan:  Plan EFM/NST, monitor contractions and for cervical change, discharge, comfort measures discussed including heat, ice, fluids   Follow Up Appointment already scheduled   Electronic Signatures: Letitia LibraHarris, Robert Paul (MD)  (Signed 12-Feb-16 13:27)  Authored: L&D Evaluation   Last Updated: 12-Feb-16 13:27 by Letitia LibraHarris, Robert Paul (MD)

## 2014-08-11 NOTE — H&P (Signed)
L&D Evaluation:  History:  HPI 24 year old G3 39P2002 with EDC=06/13/2014 presents at 25wk 3 days with c/o  back pain. She was seen in the office yesterday and c/o back pain. She has also been to L&D with reports of back pain earlier in her pregnancy.  She denies any contraction like pain, or dysuria  She denies VB, LOF, dysuria, vomiting, diarrhea, or fever.  [facility] for [facility] onset [facility] with a 25wk1d ultrasound confirming dates(-4days), HSV II, and chronic LLQ pain.   Presents with back pain   Patient's Medical History HSV II   Patient's Surgical History Wisdom teeth extraction   Medications Pre Natal Vitamins   Allergies Sulfa   Social History drugs  + history for MDMA, crack cocaine, and benzo's   Family History Non-Contributory   ROS:  ROS see HPI   Exam:  Vital Signs stable   General no apparent distress   Mental Status clear   Abdomen gravid, non-tender   Pelvic other, deferred   Mebranes Intact   FHT normal rate with no decels, 130, moderate variability, +accels, no decels   Ucx absent   Skin dry, no lesions, no rashes   Other UA negative- see results Pt appears in no distress. She is texting on her phone and eating peanut butter and crackers.   Impression:  Impression IUP at 25 3/7 weeks with back pain   Plan:  Plan discharge, comfort measures discussed including heat, ice, physical therapy, chiropractor, accupuncture, massage.   Follow Up Appointment already scheduled. 2 weeks   Electronic Signatures: Jannet MantisSubudhi, Anyia Gierke (CNM)  (Signed 27-Jan-16 14:35)  Authored: L&D Evaluation   Last Updated: 27-Jan-16 14:35 by Jannet MantisSubudhi, Nahomy Limburg (CNM)

## 2014-08-11 NOTE — H&P (Signed)
L&D Evaluation:  History:  HPI 24 year old G3 63P2002 with EDC=06/13/2014 presents at 21 wk 3 days with c/o acute onset of BLAPs and contractions at 0600 this AM. The BLAP is off and on but the lower part of her uterus  felt "tight". Also complains of some nausea this AM. Denies VB, LOF, dysuria, vomiting, diarrhea or fever. Has "alot of discharge" that is white and without odor. Has not eaten or drunk today. Did have intercourse last night. [facility] with a 7wk1d ultrasound confirming dates(-4days), HSV II, and chronic LLQ pain. Recently had a GB ultrasound done which was normal.   Presents with abdominal pain, contractions   Patient's Medical History HSV II   Patient's Surgical History Wisdom teeth extraction   Medications Pre Natal Vitamins   Allergies Sulfa   Social History none   Family History Non-Contributory   ROS:  ROS see HPI   Exam:  Vital Signs stable   Urine Protein UA negative   General no apparent distress   Mental Status clear   Abdomen tender over round ligaments bilaterally and displacing uterus replicates pain. One contraction palpated   Fundal Height at U   Pelvic no external lesions, wet prep negative. Cx: long/closed/posterior/OOP   Mebranes Intact   FHT normal rate with no decels   Skin dry   Impression:  Impression IUP at 21 3/7 weeks with round ligament pain and preterm contractions   Plan:  Plan Zofran for nausea already given. PO fluids if nausea resolves. Tylenol for pain. Discharge when ready.   Electronic Signatures: Trinna BalloonGutierrez, Evanie Buckle L (CNM)  (Signed [phone]-Nov-15 11:56)  Authored: L&D Evaluation   Last Updated: 03-Nov-15 11:56 by Trinna BalloonGutierrez, Kaelan Amble L (CNM)

## 2014-08-11 NOTE — H&P (Signed)
L&D Evaluation:  History:  HPI 24 year old G3 71P2002 with EDC=06/13/2014 presents at 24 wk 6 days with c/o  back pain that has gotten worse since yesterday. She reports it wrapping around her hips. She denies any contraction like pain. She also reports some pain that goes down her leg. She also c/o insomnia and wants something to "help me sleep." She denies VB, LOF, dysuria, vomiting, diarrhea, or fever.  [facility] with a 7wk1d ultrasound confirming dates(-4days), HSV II, and chronic LLQ pain.   Presents with back pain   Patient's Medical History HSV II   Patient's Surgical History Wisdom teeth extraction   Medications Pre Natal Vitamins   Allergies Sulfa   Social History drugs  + history for MDMA, crack cocaine, and benzo's   Family History Non-Contributory   ROS:  ROS see HPI   Exam:  Vital Signs stable   General no apparent distress   Mental Status clear   Abdomen gravid, non-tender   Pelvic deferred- 1 contraction seen on monitor   Mebranes Intact   FHT normal rate with no decels, 140, moderate variability- appropriate for 24 weeks   Ucx only 1 noted on monitor   Skin dry, no lesions, no rashes   Other UA negative   Impression:  Impression IUP at 24 6/7 weeks with back pain and insomnia   Plan:  Plan discharge, comfort measures discussed. Physical therapy referral offerred to be sent at her next office appointment, recommended tylenol and unisom for insomia   Follow Up Appointment already scheduled. 12/16   Electronic Signatures: Jannet MantisSubudhi, Izayiah Tibbitts (CNM)  (Signed [phone]-Nov-15 17:24)  Authored: L&D Evaluation   Last Updated: 28-Nov-15 17:24 by Jannet MantisSubudhi, Anona Giovannini (CNM)

## 2014-08-11 NOTE — H&P (Signed)
L&D Evaluation:  History Expanded:  HPI 24 year old G3 P2002 with EDC=06/13/2014 presents at 4138w3d days with c/o contractions. Pt was evaluated last night for the same and d/c home dilated 3cm without cervical change for several hours. Now reports severe contraction pain q 1-2 min.  She denies VB, LOF or decreased FM. [facility] onset care with a 7wk1d ultrasound confirming dates(-4days), HSV II, and chronic LLQ pain.   Blood Type (Maternal) A positive   Group B Strep Results Maternal (Result >5wks must be treated as unknown) negative   Maternal HIV Negative   Maternal Syphilis Ab Nonreactive   Maternal Varicella Immune   Rubella Results (Maternal) immune   Presents with contractions   Patient's Medical History HSV II   Patient's Surgical History Wisdom teeth extraction   Medications Pre Natal Vitamins   Allergies Sulfa   Social History history of +UDS, negative UDS during pregnancy   Family History Non-Contributory   ROS:  ROS see HPI   Exam:  Vital Signs stable   General appears uncomfortable   Mental Status clear   Abdomen gravid, tender with contractions   Estimated Fetal Weight Average for gestational age   Back no CVAT   Edema no edema   Pelvic 5 cm per RN   Mebranes Intact   FHT normal rate with no decels, category 1 tracing, baseline 135, mod variability, + accels, no decels   FHT Description Tachycardia   Ucx regular, q 1-3 min   Impression:  Impression active labor   Plan:  Plan discharge   Comments Admission for labor IV, labs, IV stadol for now, epidural when labs resulted Anticipate vaginal delivery   Electronic Signatures: Vella KohlerBrothers, Cherlyn Syring K (CNM)  (Signed 15-Mar-16 02:38)  Authored: L&D Evaluation   Last Updated: 15-Mar-16 02:38 by Vella KohlerBrothers, Krish Bailly K (CNM)

## 2015-05-13 ENCOUNTER — Emergency Department
Admission: EM | Admit: 2015-05-13 | Discharge: 2015-05-13 | Disposition: A | Discharge status: Home / Self Care | Payer: Medicaid Other | Attending: Student | Admitting: Student

## 2015-05-13 ENCOUNTER — Encounter: Payer: Self-pay | Admitting: Emergency Medicine

## 2015-05-13 DIAGNOSIS — Z79899 Other long term (current) drug therapy: Secondary | ICD-10-CM | POA: Insufficient documentation

## 2015-05-13 DIAGNOSIS — M545 Low back pain, unspecified: Secondary | ICD-10-CM

## 2015-05-13 DIAGNOSIS — Y9389 Activity, other specified: Secondary | ICD-10-CM | POA: Insufficient documentation

## 2015-05-13 DIAGNOSIS — M25551 Pain in right hip: Secondary | ICD-10-CM | POA: Diagnosis present

## 2015-05-13 DIAGNOSIS — Z791 Long term (current) use of non-steroidal anti-inflammatories (NSAID): Secondary | ICD-10-CM | POA: Diagnosis not present

## 2015-05-13 DIAGNOSIS — M7071 Other bursitis of hip, right hip: Secondary | ICD-10-CM | POA: Diagnosis not present

## 2015-05-13 MED ORDER — KETOROLAC TROMETHAMINE 60 MG/2ML IM SOLN
60.0000 mg | Freq: Once | INTRAMUSCULAR | Status: AC
Start: 2015-05-13 — End: 2015-05-13
  Administered 2015-05-13: 60 mg via INTRAMUSCULAR
  Filled 2015-05-13: qty 2

## 2015-05-13 MED ORDER — MELOXICAM 15 MG PO TABS: 15.0000 mg | ORAL_TABLET | Freq: Every day | ORAL | Status: DC

## 2015-05-13 NOTE — ED Notes (Signed)
States she is currently being treated for bilateral hip bursitis  Had injections on the the 6th of feb.. States left side is improved but conts to have pain on the right side

## 2015-05-13 NOTE — ED Notes (Addendum)
Pt reports recently dx with bursitis of bilateral hips, was treated on 2/6 with medications. Pt reports bilateral pain that travels from hips down to feet; pt also reports lower back pain. Pt reports given sulindac and baclofen. 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

## 2015-05-13 NOTE — Discharge Instructions (Signed)
Back Exercises °If you have pain in your back, do these exercises 2-3 times each day or as told by your doctor. When the pain goes away, do the exercises once each day, but repeat the steps more times for each exercise (do more repetitions). If you do not have pain in your back, do these exercises once each day or as told by your doctor. °EXERCISES °Single Knee to Chest °Do these steps 3-5 times in a row for each leg: °· Lie on your back on a firm bed or the floor with your legs stretched out. °· Bring one knee to your chest. °· Hold your knee to your chest by grabbing your knee or thigh. °· Pull on your knee until you feel a gentle stretch in your lower back. °· Keep doing the stretch for 10-30 seconds. °· Slowly let go of your leg and straighten it. °Pelvic Tilt °Do these steps 5-10 times in a row: °· Lie on your back on a firm bed or the floor with your legs stretched out. °· Bend your knees so they point up to the ceiling. Your feet should be flat on the floor. °· Tighten your lower belly (abdomen) muscles to press your lower back against the floor. This will make your tailbone point up to the ceiling instead of pointing down to your feet or the floor. °· Stay in this position for 5-10 seconds while you gently tighten your muscles and breathe evenly. °Cat-Cow °Do these steps until your lower back bends more easily: °· Get on your hands and knees on a firm surface. Keep your hands under your shoulders, and keep your knees under your hips. You may put padding under your knees. °· Let your head hang down, and make your tailbone point down to the floor so your lower back is round like the back of a cat. °· Stay in this position for 5 seconds. °· Slowly lift your head and make your tailbone point up to the ceiling so your back hangs low (sags) like the back of a cow. °· Stay in this position for 5 seconds. °Press-Ups °Do these steps 5-10 times in a row: °· Lie on your belly (face-down) on the floor. °· Place your  hands near your head, about shoulder-width apart. °· While you keep your back relaxed and keep your hips on the floor, slowly straighten your arms to raise the top half of your body and lift your shoulders. Do not use your back muscles. To make yourself more comfortable, you may change where you place your hands. °· Stay in this position for 5 seconds. °· Slowly return to lying flat on the floor. °Bridges °Do these steps 10 times in a row: °1. Lie on your back on a firm surface. °2. Bend your knees so they point up to the ceiling. Your feet should be flat on the floor. °3. Tighten your butt muscles and lift your butt off of the floor until your waist is almost as high as your knees. If you do not feel the muscles working in your butt and the back of your thighs, slide your feet 1-2 inches farther away from your butt. °4. Stay in this position for 3-5 seconds. °5. Slowly lower your butt to the floor, and let your butt muscles relax. °If this exercise is too easy, try doing it with your arms crossed over your chest. °Belly Crunches °Do these steps 5-10 times in a row: °1. Lie on your back on a firm bed   or the floor with your legs stretched out. 2. Bend your knees so they point up to the ceiling. Your feet should be flat on the floor. 3. Cross your arms over your chest. 4. Tip your chin a little bit toward your chest but do not bend your neck. 5. Tighten your belly muscles and slowly raise your chest just enough to lift your shoulder blades a tiny bit off of the floor. 6. Slowly lower your chest and your head to the floor. Back Lifts Do these steps 5-10 times in a row: 1. Lie on your belly (face-down) with your arms at your sides, and rest your forehead on the floor. 2. Tighten the muscles in your legs and your butt. 3. Slowly lift your chest off of the floor while you keep your hips on the floor. Keep the back of your head in line with the curve in your back. Look at the floor while you do this. 4. Stay in  this position for 3-5 seconds. 5. Slowly lower your chest and your face to the floor. GET HELP IF:  Your back pain gets a lot worse when you do an exercise.  Your back pain does not lessen 2 hours after you exercise. If you have any of these problems, stop doing the exercises. Do not do them again unless your doctor says it is okay. GET HELP RIGHT AWAY IF:  You have sudden, very bad back pain. If this happens, stop doing the exercises. Do not do them again unless your doctor says it is okay.   This information is not intended to replace advice given to you by your health care provider. Make sure you discuss any questions you have with your health care provider.   Document Released: 04/22/2010 Document Revised: 12/09/2014 Document Reviewed: 05/14/2014 Elsevier Interactive Patient Education 2016 [employer] Inc[employer]  Back Pain, Adult Back pain is very common in adults.The cause of back pain is rarely dangerous and the pain often gets better over time.The cause of your back pain may not be known. Some common causes of back pain include:  Strain of the muscles or ligaments supporting the spine.  Wear and tear (degeneration) of the spinal disks.  Arthritis.  Direct injury to the back. For many people, back pain may return. Since back pain is rarely dangerous, most people can learn to manage this condition on their own. HOME CARE INSTRUCTIONS Watch your back pain for any changes. The following actions may help to lessen any discomfort you are feeling:  Remain active. It is stressful on your back to sit or stand in one place for long periods of time. Do not sit, drive, or stand in one place for more than 30 minutes at a time. Take short walks on even surfaces as soon as you are able.Try to increase the length of time you walk each day.  Exercise regularly as directed by your health care provider. Exercise helps your back heal faster. It also helps avoid future injury by keeping your muscles  strong and flexible.  Do not stay in bed.Resting more than 1-2 days can delay your recovery.  Pay attention to your body when you bend and lift. The most comfortable positions are those that put less stress on your recovering back. Always use proper lifting techniques, including:  Bending your knees.  Keeping the load close to your body.  Avoiding twisting.  Find a comfortable position to sleep. Use a firm mattress and lie on your side with your knees slightly  bent. If you lie on your back, put a pillow under your knees.  Avoid feeling anxious or stressed.Stress increases muscle tension and can worsen back pain.It is important to recognize when you are anxious or stressed and learn ways to manage it, such as with exercise.  Take medicines only as directed by your health care provider. Over-the-counter medicines to reduce pain and inflammation are often the most helpful.Your health care provider may prescribe muscle relaxant drugs.These medicines help dull your pain so you can more quickly return to your normal activities and healthy exercise.  Apply ice to the injured area:  Put ice in a plastic bag.  Place a towel between your skin and the bag.  Leave the ice on for 20 minutes, 2-3 times a day for the first 2-3 days. After that, ice and heat may be alternated to reduce pain and spasms.  Maintain a healthy weight. Excess weight puts extra stress on your back and makes it difficult to maintain good posture. SEEK MEDICAL CARE IF:  You have pain that is not relieved with rest or medicine.  You have increasing pain going down into the legs or buttocks.  You have pain that does not improve in one week.  You have night pain.  You lose weight.  You have a fever or chills. SEEK IMMEDIATE MEDICAL CARE IF:   You develop new bowel or bladder control problems.  You have unusual weakness or numbness in your arms or legs.  You develop nausea or vomiting.  You develop abdominal  pain.  You feel faint.   This information is not intended to replace advice given to you by your health care provider. Make sure you discuss any questions you have with your health care provider.   Document Released: 03/20/2005 Document Revised: 04/10/2014 Document Reviewed: 07/22/2013 Elsevier Interactive Patient Education 2016 Elsevier Inc.  Bursitis Bursitis is when the fluid-filled sac (bursa) that covers and protects a joint is swollen (inflamed). Bursitis is most common near joints, especially the knees, elbows, hips, and shoulders.  HOME CARE  Take medicines only as told by your doctor.  If you were prescribed an antibiotic medicine, finish it all even if you start to feel better.  Rest the affected area as told by your doctor.  Keep the area raised up.  Avoid doing things that make the pain worse.  Apply ice to the injured area:  Place ice in a plastic bag.  Place a towel between your skin and the bag.  Leave the ice on for 20 minutes, 2-3 times a day.  Use splints, braces, pads, or walking aids as told by your doctor.  Keep all follow-up visits as told by your doctor. This is important. GET HELP IF:   You have more pain with home care.  You have a fever.  You have chills.   This information is not intended to replace advice given to you by your health care provider. Make sure you discuss any questions you have with your health care provider.   Document Released: 09/07/2009 Document Revised: 04/10/2014 Document Reviewed: 06/09/2013 Elsevier Interactive Patient Education [employer]

## 2015-05-13 NOTE — ED Provider Notes (Signed)
[facility] Emergency Department Provider Note  ____________________________________________  Time seen: Approximately 2:36 PM  I have reviewed the triage vital signs and the nursing notes.   HISTORY  Chief Complaint Hip Pain and Back Pain    HPI Vicki Black is a 25 y.o. female, NAD, presents to the emergency department with three day history lower back pain an right hip pain. Notes she was treated by an orthopedist in [place] for bilateral hip bursitis. Was given injections of Celestone in both hips. Notes improvement of left hip but pain continues in the right. Saw her primary care doctor at [facility] who gave her the sulindac which is not helping. 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   History reviewed. No pertinent past medical history.  There are no active problems to display for this patient.   History reviewed. No pertinent past surgical history.  Current Outpatient Rx  Name  Route  Sig  Dispense  Refill  . baclofen (LIORESAL) 20 MG tablet   Oral   Take 20 mg by mouth 3 (three) times daily.         . sulindac (CLINORIL) 200 MG tablet   Oral   Take 200 mg by mouth 2 (two) times daily.           Allergies Sulfa antibiotics  No family history on file.  Social History Social History  Substance Use Topics  . Smoking status: Never Smoker   . Smokeless tobacco: None  . Alcohol Use: No     Review of Systems  Constitutional: No fever/chills Cardiovascular: No chest pain. Respiratory:  No shortness of breath.  Gastrointestinal: No abdominal pain.  No nausea, vomiting.   Genitourinary: Negative for dysuria. No hematuria. No urinary hesitancy, urgency or increased frequency. Musculoskeletal: Positive for diffuse lower back pain with some radiation to the right. Positive for right hip pain.  Skin: Negative for rash or bruising. Neurological:  Negative for headaches, focal weakness or numbness. No tingling in extremities. 10-point ROS otherwise negative.  ____________________________________________   PHYSICAL EXAM:  VITAL SIGNS: ED Triage Vitals  Enc Vitals Group     BP 05/13/15 1341 131/95 mmHg     Pulse Rate 05/13/15 1341 71     Resp 05/13/15 1341 16     Temp 05/13/15 1341 98.5 F (36.9 C)     Temp Source 05/13/15 1341 Oral     SpO2 05/13/15 1341 100 %     Weight 05/13/15 1407 128 lb (58.06 kg)     Height 05/13/15 1407  (1.676 m)     Head Cir --      Peak Flow --      Pain Score 05/13/15 1341 10     Pain Loc --      Pain Edu? --      Excl. in GC? --     Constitutional: Alert and oriented. Well appearing and in no acute distress. Eyes: Conjunctivae are normal.  Head: Atraumatic. Cardiovascular: Normal rate, regular rhythm. Normal S1 and S2.  Good peripheral circulation. Respiratory: Normal respiratory effort without tachypnea or retractions. Lungs CTAB. Musculoskeletal: Diffuse, mild tenderness to palpation over the lower lumbar area. No muscle spasm appreciated. Tenderness about the right lateral hip with deep palpation. In the right hip increases with right straight leg raise. No SI joint tenderness.   No joint effusions. Neurologic:  Normal speech and  language. No gross focal neurologic deficits are appreciated.  Skin:  Skin is warm, dry and intact. No rash noted. Psychiatric: Mood and affect are normal. Speech and behavior are normal. Patient exhibits appropriate insight and judgement.   ____________________________________________   LABS  None ____________________________________________  EKG  None ____________________________________________  RADIOLOGY  None ____________________________________________    PROCEDURES  Procedure(s) performed: None    Medications  ketorolac (TORADOL) injection 60 mg (not administered)      ____________________________________________   INITIAL IMPRESSION / ASSESSMENT AND PLAN / ED COURSE  Patient's diagnosis is consistent with lumbar go in right hip bursitis. Patient will be discharged home with prescriptions for meloxicam 15 mg to take one tablet by mouth once daily. Patient will stop use of sulindac.  He also take Tylenol [phone] mg every 8 hours as needed for acute pain. Patient is to follow up with her primary care at [facility] if symptoms persist past this treatment course. Patient is given ED precautions to return to the ED for any worsening or new symptoms.    ____________________________________________  FINAL CLINICAL IMPRESSION(S) / ED DIAGNOSES  Final diagnoses:  None      NEW MEDICATIONS STARTED DURING THIS VISIT:  New Prescriptions   No medications on file         Hope Pigeon, PA-C 05/13/15 1513  Gayla Doss, MD 05/13/15 1551

## 2015-05-17 ENCOUNTER — Encounter: Payer: Self-pay | Admitting: *Deleted

## 2015-05-17 DIAGNOSIS — J069 Acute upper respiratory infection, unspecified: Secondary | ICD-10-CM | POA: Insufficient documentation

## 2015-05-17 DIAGNOSIS — R079 Chest pain, unspecified: Secondary | ICD-10-CM | POA: Diagnosis present

## 2015-05-17 DIAGNOSIS — Z791 Long term (current) use of non-steroidal anti-inflammatories (NSAID): Secondary | ICD-10-CM | POA: Diagnosis not present

## 2015-05-17 DIAGNOSIS — F419 Anxiety disorder, unspecified: Secondary | ICD-10-CM | POA: Diagnosis not present

## 2015-05-17 DIAGNOSIS — Z79899 Other long term (current) drug therapy: Secondary | ICD-10-CM | POA: Insufficient documentation

## 2015-05-17 LAB — CBC
HCT: 40.8 % (ref 35.0–47.0)
HEMOGLOBIN: 13.8 g/dL (ref 12.0–16.0)
MCH: 30.2 pg (ref 26.0–34.0)
MCHC: 33.9 g/dL (ref 32.0–36.0)
MCV: 89.3 fL (ref 80.0–100.0)
Platelets: 245 10*3/uL (ref 150–440)
RBC: 4.57 MIL/uL (ref 3.80–5.20)
RDW: 13.4 % (ref 11.5–14.5)
WBC: 5.9 10*3/uL (ref 3.6–11.0)

## 2015-05-17 LAB — BASIC METABOLIC PANEL
Anion gap: 6 (ref 5–15)
BUN: 18 mg/dL (ref 6–20)
CO2: 26 mmol/L (ref 22–32)
CREATININE: 0.89 mg/dL (ref 0.44–1.00)
Calcium: 9.1 mg/dL (ref 8.9–10.3)
Chloride: 104 mmol/L (ref 101–111)
GFR calc Af Amer: >60 mL/min (ref >60)
GLUCOSE: 89 mg/dL (ref 65–99)
POTASSIUM: 4.1 mmol/L (ref 3.5–5.1)
Sodium: 136 mmol/L (ref 135–145)

## 2015-05-17 LAB — TROPONIN I: Troponin I: 0.03 ng/mL (ref <0.031)

## 2015-05-17 NOTE — ED Notes (Signed)
Pt reports pain in center of chest since yesterday.  Intermittent pain.  No sob. Pt took klonopin with some relief.  No sob.  Pt also reports a cough.

## 2015-05-18 ENCOUNTER — Emergency Department
Admission: EM | Admit: 2015-05-18 | Discharge: 2015-05-18 | Disposition: A | Discharge status: Home / Self Care | Payer: Medicaid Other | Attending: Emergency Medicine | Admitting: Emergency Medicine

## 2015-05-18 ENCOUNTER — Emergency Department: Payer: Medicaid Other

## 2015-05-18 DIAGNOSIS — F410 Panic disorder [episodic paroxysmal anxiety] without agoraphobia: Secondary | ICD-10-CM

## 2015-05-18 DIAGNOSIS — J069 Acute upper respiratory infection, unspecified: Secondary | ICD-10-CM
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
## 2015-05-18 NOTE — Discharge Instructions (Signed)
Upper Respiratory Infection, Adult °Most upper respiratory infections (URIs) are a viral infection of the air passages leading to the lungs. A URI affects the nose, throat, and upper air passages. The most common type of URI is nasopharyngitis and is typically referred to as "the common cold." °URIs run their course and usually go away on their own. Most of the time, a URI does not require medical attention, but sometimes a bacterial infection in the upper airways can follow a viral infection. This is called a secondary infection. Sinus and middle ear infections are common types of secondary upper respiratory infections. °Bacterial pneumonia can also complicate a URI. A URI can worsen asthma and chronic obstructive pulmonary disease (COPD). Sometimes, these complications can require emergency medical care and may be life threatening.  °CAUSES °Almost all URIs are caused by viruses. A virus is a type of germ and can spread from one person to another.  °RISKS FACTORS °You may be at risk for a URI if:  °· You smoke.   °· You have chronic heart or lung disease. °· You have a weakened defense (immune) system.   °· You are very young or very old.   °· You have nasal allergies or asthma. °· You work in crowded or poorly ventilated areas. °· You work in health care facilities or schools. °SIGNS AND SYMPTOMS  °Symptoms typically develop 2-3 days after you come in contact with a cold virus. Most viral URIs last 7-10 days. However, viral URIs from the influenza virus (flu virus) can last 14-18 days and are typically more severe. Symptoms may include:  °· Runny or stuffy (congested) nose.   °· Sneezing.   °· Cough.   °· Sore throat.   °· Headache.   °· Fatigue.   °· Fever.   °· Loss of appetite.   °· Pain in your forehead, behind your eyes, and over your cheekbones (sinus pain). °· Muscle aches.   °DIAGNOSIS  °Your health care provider may diagnose a URI by: °· Physical exam. °· Tests to check that your symptoms are not due to  another condition such as: °· Strep throat. °· Sinusitis. °· Pneumonia. °· Asthma. °TREATMENT  °A URI goes away on its own with time. It cannot be cured with medicines, but medicines may be prescribed or recommended to relieve symptoms. Medicines may help: °· Reduce your fever. °· Reduce your cough. °· Relieve nasal congestion. °HOME CARE INSTRUCTIONS  °· Take medicines only as directed by your health care provider.   °· Gargle warm saltwater or take cough drops to comfort your throat as directed by your health care provider. °· Use a warm mist humidifier or inhale steam from a shower to increase air moisture. This may make it easier to breathe. °· Drink enough fluid to keep your urine clear or pale yellow.   °· Eat soups and other clear broths and maintain good nutrition.   °· Rest as needed.   °· Return to work when your temperature has returned to normal or as your health care provider advises. You may need to stay home longer to avoid infecting others. You can also use a face mask and careful hand washing to prevent spread of the virus. °· Increase the usage of your inhaler if you have asthma.   °· Do not use any tobacco products, including cigarettes, chewing tobacco, or electronic cigarettes. If you need help quitting, ask your health care provider. °PREVENTION  °The best way to protect yourself from getting a cold is to practice good hygiene.  °· Avoid oral or hand contact with people with cold   symptoms.   Wash your hands often if contact occurs.  There is no clear evidence that vitamin C, vitamin E, echinacea, or exercise reduces the chance of developing a cold. However, it is always recommended to get plenty of rest, exercise, and practice good nutrition.  SEEK MEDICAL CARE IF:   You are getting worse rather than better.   Your symptoms are not controlled by medicine.   You have chills.  You have worsening shortness of breath.  You have Vicki Black or red mucus.  You have yellow or Vicki Black nasal  discharge.  You have pain in your face, especially when you bend forward.  You have a fever.  You have swollen neck glands.  You have pain while swallowing.  You have white areas in the back of your throat. SEEK IMMEDIATE MEDICAL CARE IF:   You have severe or persistent:  Headache.  Ear pain.  Sinus pain.  Chest pain.  You have chronic lung disease and any of the following:  Wheezing.  Prolonged cough.  Coughing up blood.  A change in your usual mucus.  You have a stiff neck.  You have changes in your:  Vision.  Hearing.  Thinking.  Mood. MAKE SURE YOU:   Understand these instructions.  Will watch your condition.  Will get help right away if you are not doing well or get worse.   This information is not intended to replace advice given to you by your health care provider. Make sure you discuss any questions you have with your health care provider.   Document Released: 09/13/2000 Document Revised: 08/04/2014 Document Reviewed: 06/25/2013 Elsevier Interactive Patient Education 2016 Elsevier Inc.  Panic Attacks Panic attacks are sudden, short-livedsurges of severe anxiety, fear, or discomfort. They may occur for no reason when you are relaxed, when you are anxious, or when you are sleeping. Panic attacks may occur for a number of reasons:   Healthy people occasionally have panic attacks in extreme, life-threatening situations, such as war or natural disasters. Normal anxiety is a protective mechanism of the body that helps [place] react to danger (fight or flight response).  Panic attacks are often seen with anxiety disorders, such as panic disorder, social anxiety disorder, generalized anxiety disorder, and phobias. Anxiety disorders cause excessive or uncontrollable anxiety. They may interfere with your relationships or other life activities.  Panic attacks are sometimes seen with other mental illnesses, such as depression and posttraumatic stress  disorder.  Certain medical conditions, prescription medicines, and drugs of abuse can cause panic attacks. SYMPTOMS  Panic attacks start suddenly, peak within 20 minutes, and are accompanied by four or more of the following symptoms:  Pounding heart or fast heart rate (palpitations).  Sweating.  Trembling or shaking.  Shortness of breath or feeling smothered.  Feeling choked.  Chest pain or discomfort.  Nausea or strange feeling in your stomach.  Dizziness, light-headedness, or feeling like you will faint.  Chills or hot flushes.  Numbness or tingling in your lips or hands and feet.  Feeling that things are not real or feeling that you are not yourself.  Fear of losing control or going crazy.  Fear of dying. Some of these symptoms can mimic serious medical conditions. For example, you may think you are having a heart attack. Although panic attacks can be very scary, they are not life threatening. DIAGNOSIS  Panic attacks are diagnosed through an assessment by your health care provider. Your health care provider will ask questions about your symptoms, such as  where and when they occurred. Your health care provider will also ask about your medical history and use of alcohol and drugs, including prescription medicines. Your health care provider may order blood tests or other studies to rule out a serious medical condition. Your health care provider may refer you to a mental health professional for further evaluation. TREATMENT   Most healthy people who have one or two panic attacks in an extreme, life-threatening situation will not require treatment.  The treatment for panic attacks associated with anxiety disorders or other mental illness typically involves counseling with a mental health professional, medicine, or a combination of both. Your health care provider will help determine what treatment is best for you.  Panic attacks due to physical illness usually go away with  treatment of the illness. If prescription medicine is causing panic attacks, talk with your health care provider about stopping the medicine, decreasing the dose, or substituting another medicine.  Panic attacks due to alcohol or drug abuse go away with abstinence. Some adults need professional help in order to stop drinking or using drugs. HOME CARE INSTRUCTIONS   Take all medicines as directed by your health care provider.   Schedule and attend follow-up visits as directed by your health care provider. It is important to keep all your appointments. SEEK MEDICAL CARE IF:  You are not able to take your medicines as prescribed.  Your symptoms do not improve or get worse. SEEK IMMEDIATE MEDICAL CARE IF:   You experience panic attack symptoms that are different than your usual symptoms.  You have serious thoughts about hurting yourself or others.  You are taking medicine for panic attacks and have a serious side effect. MAKE SURE YOU:  Understand these instructions.  Will watch your condition.  Will get help right away if you are not doing well or get worse.   This information is not intended to replace advice given to you by your health care provider. Make sure you discuss any questions you have with your health care provider.   Document Released: 03/20/2005 Document Revised: 03/25/2013 Document Reviewed: 11/01/2012 Elsevier Interactive Patient Education [employer]

## 2015-05-19 ENCOUNTER — Encounter: Payer: Self-pay | Admitting: *Deleted

## 2015-05-19 ENCOUNTER — Emergency Department: Payer: Medicaid Other

## 2015-05-19 ENCOUNTER — Emergency Department
Admission: EM | Admit: 2015-05-19 | Discharge: 2015-05-19 | Disposition: A | Discharge status: Home / Self Care | Payer: Medicaid Other | Attending: Emergency Medicine | Admitting: Emergency Medicine

## 2015-05-19 DIAGNOSIS — Z791 Long term (current) use of non-steroidal anti-inflammatories (NSAID): Secondary | ICD-10-CM | POA: Diagnosis not present

## 2015-05-19 DIAGNOSIS — J069 Acute upper respiratory infection, unspecified: Secondary | ICD-10-CM | POA: Insufficient documentation

## 2015-05-19 DIAGNOSIS — Z79899 Other long term (current) drug therapy: Secondary | ICD-10-CM | POA: Diagnosis not present

## 2015-05-19 DIAGNOSIS — R05 Cough: Secondary | ICD-10-CM | POA: Diagnosis present

## 2015-05-19 MED ORDER — PSEUDOEPH-BROMPHEN-DM 30-2-10 MG/5ML PO SYRP: 10.0000 mL | ORAL_SOLUTION | Freq: Four times a day (QID) | ORAL | Status: DC | PRN

## 2015-05-19 MED ORDER — ALBUTEROL SULFATE HFA 108 (90 BASE) MCG/ACT IN AERS
1.0000 | INHALATION_SPRAY | Freq: Four times a day (QID) | RESPIRATORY_TRACT | Status: DC | PRN
Start: 2015-05-19 — End: 2016-07-06

## 2015-05-19 NOTE — Discharge Instructions (Signed)
Upper Respiratory Infection, Adult Most upper respiratory infections (URIs) are a viral infection of the air passages leading to the lungs. A URI affects the nose, throat, and upper air passages. The most common type of URI is nasopharyngitis and is typically referred to as "the common cold." URIs run their course and usually go away on their own. Most of the time, a URI does not require medical attention, but sometimes a bacterial infection in the upper airways can follow a viral infection. This is called a secondary infection. Sinus and middle ear infections are common types of secondary upper respiratory infections. Bacterial pneumonia can also complicate a URI. A URI can worsen asthma and chronic obstructive pulmonary disease (COPD). Sometimes, these complications can require emergency medical care and may be life threatening.  CAUSES Almost all URIs are caused by viruses. A virus is a type of germ and can spread from one person to another.  RISKS FACTORS You may be at risk for a URI if:   You smoke.   You have chronic heart or lung disease.  You have a weakened defense (immune) system.   You are very young or very old.   You have nasal allergies or asthma.  You work in crowded or poorly ventilated areas.  You work in health care facilities or schools. SIGNS AND SYMPTOMS  Symptoms typically develop 2-3 days after you come in contact with a cold virus. Most viral URIs last 7-10 days. However, viral URIs from the influenza virus (flu virus) can last 14-18 days and are typically more severe. Symptoms may include:   Runny or stuffy (congested) nose.   Sneezing.   Cough.   Sore throat.   Headache.   Fatigue.   Fever.   Loss of appetite.   Pain in your forehead, behind your eyes, and over your cheekbones (sinus pain).  Muscle aches.  DIAGNOSIS  Your health care provider may diagnose a URI by:  Physical exam.  Tests to check that your symptoms are not due to  another condition such as:  Strep throat.  Sinusitis.  Pneumonia.  Asthma. TREATMENT  A URI goes away on its own with time. It cannot be cured with medicines, but medicines may be prescribed or recommended to relieve symptoms. Medicines may help:  Reduce your fever.  Reduce your cough.  Relieve nasal congestion. HOME CARE INSTRUCTIONS   Take medicines only as directed by your health care provider.   Gargle warm saltwater or take cough drops to comfort your throat as directed by your health care provider.  Use a warm mist humidifier or inhale steam from a shower to increase air moisture. This may make it easier to breathe.  Drink enough fluid to keep your urine clear or pale yellow.   Eat soups and other clear broths and maintain good nutrition.   Rest as needed.   Return to work when your temperature has returned to normal or as your health care provider advises. You may need to stay home longer to avoid infecting others. You can also use a face mask and careful hand washing to prevent spread of the virus.  Increase the usage of your inhaler if you have asthma.   Do not use any tobacco products, including cigarettes, chewing tobacco, or electronic cigarettes. If you need help quitting, ask your health care provider. PREVENTION  The best way to protect yourself from getting a cold is to practice good hygiene.   Avoid oral or hand contact with people with cold   symptoms.   Wash your hands often if contact occurs.  There is no clear evidence that vitamin C, vitamin E, echinacea, or exercise reduces the chance of developing a cold. However, it is always recommended to get plenty of rest, exercise, and practice good nutrition.  SEEK MEDICAL CARE IF:   You are getting worse rather than better.   Your symptoms are not controlled by medicine.   You have chills.  You have worsening shortness of breath.  You have brown or red mucus.  You have yellow or brown nasal  discharge.  You have pain in your face, especially when you bend forward.  You have a fever.  You have swollen neck glands.  You have pain while swallowing.  You have white areas in the back of your throat. SEEK IMMEDIATE MEDICAL CARE IF:   You have severe or persistent:  Headache.  Ear pain.  Sinus pain.  Chest pain.  You have chronic lung disease and any of the following:  Wheezing.  Prolonged cough.  Coughing up blood.  A change in your usual mucus.  You have a stiff neck.  You have changes in your:  Vision.  Hearing.  Thinking.  Mood. MAKE SURE YOU:   Understand these instructions.  Will watch your condition.  Will get help right away if you are not doing well or get worse.   This information is not intended to replace advice given to you by your health care provider. Make sure you discuss any questions you have with your health care provider.   Document Released: 09/13/2000 Document Revised: 08/04/2014 Document Reviewed: 06/25/2013 Elsevier Interactive Patient Education 2016 Elsevier Inc.  

## 2015-05-19 NOTE — ED Notes (Signed)
Pt state she is having some upper chest discomfort, states she was here recently with the same complaint and told she was shaving a panic attack, pt states cough with some mild sputum production, pt currently on abx for UTI and Bacterial vagionsis

## 2015-05-19 NOTE — ED Provider Notes (Signed)
[facility] Emergency Department Provider Note  ____________________________________________  Time seen: Approximately 11:10 AM  I have reviewed the triage vital signs and the nursing notes.   HISTORY  Chief Complaint Cough and Chest Pain    HPI Vicki Black is a 25 y.o. female, NAD, presents emergency department for upper chest discomfort, cough they can be productive. Was seen in this emergency department yesterday for similar complaints and diagnosed with panic attack and URI. She notes the cough is worsened over the last 24 hours. No she feels a sharp pain in her lower chest when she drinks fluids. As had no problems swallowing fluids or solids. Notes some low-grade temperatures and moderate body aches. Has a child is also sick at home. Is currently on clindamycin for BV.   History reviewed. No pertinent past medical history.  There are no active problems to display for this patient.   History reviewed. No pertinent past surgical history.  Current Outpatient Rx  Name  Route  Sig  Dispense  Refill  . albuterol (PROVENTIL HFA;VENTOLIN HFA) 108 (90 Base) MCG/ACT inhaler   Inhalation   Inhale 1-2 puffs into the lungs every 6 (six) hours as needed for wheezing or shortness of breath.   1 Inhaler   0   . baclofen (LIORESAL) 20 MG tablet   Oral   Take 20 mg by mouth 3 (three) times daily.         . brompheniramine-pseudoephedrine-DM 30-2-10 MG/5ML syrup   Oral   Take 10 mLs by mouth 4 (four) times daily as needed.   200 mL   0   . meloxicam (MOBIC) 15 MG tablet   Oral   Take 1 tablet (15 mg total) by mouth daily.   30 tablet   0     Allergies Sulfa antibiotics  History reviewed. No pertinent family history.  Social History Social History  Substance Use Topics  . Smoking status: Never Smoker   . Smokeless tobacco: None  . Alcohol Use: Yes     Review of Systems  Constitutional: Positive fever/chills.  Eyes: No visual changes.  No discharge ENT: Positive sore throat, nasal congestion. No sinus pressure or pain. Cardiovascular: Positive chest pain. Respiratory: Positive cough productive of trace sputum. No shortness of breath. No wheezing.  Gastrointestinal: No abdominal pain.  No nausea, vomiting.   Musculoskeletal: Positive mild general myalgias.  Skin: Negative for rash. Neurological: Negative for headaches, focal weakness or numbness. 10-point ROS otherwise negative.  ____________________________________________   PHYSICAL EXAM:  VITAL SIGNS: ED Triage Vitals  Enc Vitals Group     BP 05/19/15 1037 110/66 mmHg     Pulse Rate 05/19/15 1037 108     Resp 05/19/15 1037 18     Temp 05/19/15 1037 98.9 F (37.2 C)     Temp Source 05/19/15 1037 Oral     SpO2 05/19/15 1037 98 %     Weight 05/19/15 1037 122 lb (55.339 kg)     Height 05/19/15 1037  (1.651 m)     Head Cir --      Peak Flow --      Pain Score 05/19/15 1038 8     Pain Loc --      Pain Edu? --      Excl. in GC? --     Constitutional: Alert and oriented. Well appearing and in no acute distress. Eyes: Conjunctivae are normal. PERRL. EOMI without pain.  Head: Atraumatic. ENT:  Ears: TMs visualized bilaterally without perforation, erythema, effusion.      Nose:  Trace congestion/rhinnorhea.      Mouth/Throat: Mucous membranes are moist. Pharynx without erythema, swelling, exudate Neck: No stridor. No cervical spine tenderness to palpation. Hematological/Lymphatic/Immunilogical: No cervical lymphadenopathy. Cardiovascular: Normal rate, regular rhythm. Normal S1 and S2.  Good peripheral circulation. Respiratory: Normal respiratory effort without tachypnea or retractions. Lungs CTAB. Neurologic:  Normal speech and language. No gross focal neurologic deficits are appreciated.  Skin:  Skin is warm, dry and intact. No rash noted. Psychiatric: Mood and affect are normal. Speech and behavior are normal. Patient exhibits appropriate insight  and judgement.   ____________________________________________   LABS  None ______________  EKG  Normal sinus rhythm with no acute changes and no evidence of STEMI. EKG also reviewed by Dr. Rockne Menghini. ____________________________________________  RADIOLOGY I have personally viewed and evaluated these images (plain radiographs) as part of my medical decision making, as well as reviewing the written report by the radiologist.  Dg Chest 2 View  05/19/2015  CLINICAL DATA:  Chest pain for 2 days.  Cough for 2 days. EXAM: CHEST  2 VIEW COMPARISON:  May 18, 2015 FINDINGS: Lungs are clear. Heart size and pulmonary vascularity are normal. No pneumothorax. No adenopathy. No bone lesions. IMPRESSION: No edema or consolidation. Electronically Signed   By: Bretta Bang III M.D.   On: 05/19/2015 11:14    ____________________________________________    PROCEDURES  Procedure(s) performed: None    Medications - No data to display   ____________________________________________   INITIAL IMPRESSION / ASSESSMENT AND PLAN / ED COURSE  Pertinent labs & imaging results that were available during my care of the patient were reviewed by me and considered in my medical decision making (see chart for details).  Patient's diagnosis is consistent with viral upper respiratory infection. Patient will be discharged home with prescriptions for albuterol inhaler to utilize one to 2 puffs by mouth every 4-6 hours as needed. Also given Bromfed-DM to use as prescribed. Patient is to follow up with primary care physician at [facility] in [place], [place] if symptoms persist past this treatment course. Patient is given ED precautions to return to the ED for any worsening or new symptoms.    ____________________________________________  FINAL CLINICAL IMPRESSION(S) / ED DIAGNOSES  Final diagnoses:  Upper respiratory infection      NEW MEDICATIONS STARTED DURING THIS  VISIT:  New Prescriptions   ALBUTEROL (PROVENTIL HFA;VENTOLIN HFA) 108 (90 BASE) MCG/ACT INHALER    Inhale 1-2 puffs into the lungs every 6 (six) hours as needed for wheezing or shortness of breath.   BROMPHENIRAMINE-PSEUDOEPHEDRINE-DM 30-2-10 MG/5ML SYRUP    Take 10 mLs by mouth 4 (four) times daily as needed.         Hope Pigeon, PA-C 05/19/15 1206  Rockne Menghini, MD 05/19/15 1541

## 2015-06-29 ENCOUNTER — Encounter: Payer: Self-pay | Admitting: Dietician

## 2015-06-29 ENCOUNTER — Encounter: Payer: Medicaid Other | Attending: Pediatrics | Admitting: Dietician

## 2015-06-29 VITALS — Ht 65.0 in | Wt 116.0 lb

## 2015-06-29 DIAGNOSIS — R634 Abnormal weight loss: Secondary | ICD-10-CM | POA: Diagnosis present

## 2015-06-29 NOTE — Patient Instructions (Signed)
Establish  a consistent meal pattern of 3 meals spaced 4-6 hours apart. Within 1 hour of getting up in the morning, drink breakfast drink. Add 1 serving of fruit. Include protein food such as peanut butter or cheese or meat or nuts at lunch. Add starch servings such as bread. Ex. peanut butter or [place] or ham or tuna sandwich.  Try to add a fruit or yogurt. Eat 1/4c.-1/2 cup nuts as a snack. Dinner: Include at least 3 oz. Meat + at least 1 cup of something starchy (rice, potatoes, limas, pasta- refer to list)+ green vegetables with added oil. Add oil to any appropriate foods. Add margarine that does not contain trans fats ( I Can't Believe it's not butter, Promise brands, Smart balance) Refer to list given of suggestions to increase calories and protein Drink breakfast drink for evening snack.

## 2015-06-29 NOTE — Progress Notes (Signed)
Medical Nutrition Therapy: Visit start time: 1345  end time: 1445 Assessment:  Diagnosis: abnormal weight loss Past medical history: bursitis of both hips, neck and back pain Psychosocial issues/ stress concerns: Patient rates her stress as moderate to high and indicates "ok- not so well" as to how she is dealing with her stress. She reports she copes with stress by "shutting myself away from people and "drinking". She says that her grandmother whom she lives with had a stroke 2 weeks ago so she is trying to help her out financially as well as support her 26  And 63 year old children as well as send money for another child that does not live in her home.  Preferred learning method:  . Auditory  Current weight: 116 lbs  Height: 65 in Medications, supplements: see list Progress and evaluation:  Patient accompanied by her grandmother came for initial medical nutrition therapy visit. Her grandmother had to leave early to pick up patient's 25 year old from school. Patient states that she continues to lose weight. She reports a weight loss of 13 lbs since the first of February. Her documented weight from the doctor's office indicates a 5.5 lb weight loss in past month. She states that she doesn't have a desire to eat. She attributes most of this to the stress she has experienced in past 2 months both financially and emotionally regarding concern about her grandmother's health and a "break up" with her boyfriend. She states if food is put in front of me, I will eat it but otherwise I have no desire to cook a meal or put a meal together. She also takes clonazepam which can have loss of appetite as a side effect. She reports that she will grab a granola bar or drink a breakfast shakes or eat a meal if her grandmother prepares it.  Sometimes she drinks 2-3 breakfast shakes/day rather than eating. Her present diet is low in all food groups. She has long term history of not including calcium sources. 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 Physical activity: reports no exercise other than when she is working.  Dietary Intake:  Usual eating pattern includes 1-2 meals and 1-2 snacks per day. Dining out frequency: 0-1 meals per week.  Breakfast: sometimes sleeps late and on other days will get up to help get her daughter ready for school but does not eat breakfast.  Lunch: 12:00 granola bar or breakfast shake (15 gm protein)  Supper: 5-8:00pm- small amount of meat that grandmother prepares, biscuit with gravy or macaroni/cheese for examples. Sometimes drinks breakfast shake instead of eating. Snack: Has been drinking a breakfast shake lately   Nutrition Care Education: To promote weight gain: Discussed importance of establishing a consistent meal pattern of 3 meals spaced 4-6 hours apart. Worked with patient to explore meal options that would require very little preparation including foods she will eat and foods available. Gave and reviewed a list of ways to increase calories and protein in the diet.  Explained that decrease in appetite may by due to a combination of stress and medication and discussed importance of her pushing herself to eat to maintain strength and energy and to avoid risk of developing an eating disorder. Also, recommended to avoid or limit alcohol especially with possible interaction between alcohol and clonazepam. Intervention:  Establish  a consistent meal pattern of 3 meals spaced 4-6 hours apart. Within 1 hour of  getting up in the morning, drink breakfast drink. Add 1 serving of fruit. Include protein food such as peanut butter or cheese or meat or nuts at lunch. Add starch servings such as bread. Ex. peanut butter or [place] or ham or tuna sandwich.  Try to add a fruit or yogurt. Eat 1/4c.-1/2 cup nuts as a snack. Dinner: Include at least 3 oz. Meat + at least 1 cup of something starchy (rice, potatoes, limas, pasta- refer to  list)+ green vegetables with added oil. Add oil to any appropriate foods. Add margarine that does not contain trans fats ( I Can't Believe it's not butter, Promise brands, Smart balance) Refer to list given of suggestions to increase calories and protein Drink breakfast drink for evening snack.   Education Materials given:  . Food lists/ Planning A Balanced Meal . Sample meal pattern/ menus . List of ways to increase calories and protein . Goals/ instructions  Learner/ who was taught:  . Patient  Level of understanding: . Partial understanding; needs review/ practice Learning barriers: . None Willingness to learn/ readiness for change: . Hesitance, contemplating change  Monitoring and Evaluation:  Dietary intake, exercise, , and body weight      follow up: 07/23/15

## 2015-07-23 ENCOUNTER — Ambulatory Visit: Payer: Medicaid Other | Admitting: Dietician

## 2015-08-03 ENCOUNTER — Other Ambulatory Visit: Payer: Self-pay | Admitting: Pediatrics

## 2015-08-03 DIAGNOSIS — R1032 Left lower quadrant pain: Secondary | ICD-10-CM

## 2015-08-05 ENCOUNTER — Encounter: Payer: Self-pay | Admitting: Emergency Medicine

## 2015-08-05 ENCOUNTER — Ambulatory Visit: Payer: Medicaid Other

## 2015-08-05 ENCOUNTER — Emergency Department
Admission: EM | Admit: 2015-08-05 | Discharge: 2015-08-06 | Disposition: A | Discharge status: Home / Self Care | Payer: Medicaid Other | Attending: Emergency Medicine | Admitting: Emergency Medicine

## 2015-08-05 DIAGNOSIS — B373 Candidiasis of vulva and vagina: Secondary | ICD-10-CM | POA: Insufficient documentation

## 2015-08-05 DIAGNOSIS — Z79899 Other long term (current) drug therapy: Secondary | ICD-10-CM | POA: Diagnosis not present

## 2015-08-05 DIAGNOSIS — B3731 Acute candidiasis of vulva and vagina: Secondary | ICD-10-CM

## 2015-08-05 DIAGNOSIS — R1032 Left lower quadrant pain: Secondary | ICD-10-CM | POA: Diagnosis present

## 2015-08-05 DIAGNOSIS — R102 Pelvic and perineal pain: Secondary | ICD-10-CM | POA: Diagnosis not present

## 2015-08-05 LAB — URINALYSIS COMPLETE WITH MICROSCOPIC (ARMC ONLY)
Bilirubin Urine: NEGATIVE
Glucose, UA: NEGATIVE mg/dL
HGB URINE DIPSTICK: NEGATIVE
KETONES UR: NEGATIVE mg/dL
Leukocytes, UA: NEGATIVE
NITRITE: NEGATIVE
PH: 5 (ref 5.0–8.0)
PROTEIN: 100 mg/dL — AB
SPECIFIC GRAVITY, URINE: 1.026 (ref 1.005–1.030)

## 2015-08-05 LAB — CBC
HCT: 40.8 % (ref 35.0–47.0)
HEMOGLOBIN: 13.6 g/dL (ref 12.0–16.0)
MCH: 30.7 pg (ref 26.0–34.0)
MCHC: 33.4 g/dL (ref 32.0–36.0)
MCV: 91.8 fL (ref 80.0–100.0)
Platelets: 270 10*3/uL (ref 150–440)
RBC: 4.44 MIL/uL (ref 3.80–5.20)
RDW: 13.5 % (ref 11.5–14.5)
WBC: 7.6 10*3/uL (ref 3.6–11.0)

## 2015-08-05 LAB — COMPREHENSIVE METABOLIC PANEL
ALBUMIN: 5 g/dL (ref 3.5–5.0)
ALK PHOS: 46 U/L (ref 38–126)
ALT: 15 U/L (ref 14–54)
ANION GAP: 7 (ref 5–15)
AST: 17 U/L (ref 15–41)
BILIRUBIN TOTAL: 0.5 mg/dL (ref 0.3–1.2)
BUN: 14 mg/dL (ref 6–20)
CALCIUM: 9.4 mg/dL (ref 8.9–10.3)
CO2: 27 mmol/L (ref 22–32)
Chloride: 103 mmol/L (ref 101–111)
Creatinine, Ser: 0.67 mg/dL (ref 0.44–1.00)
GFR calc Af Amer: >60 mL/min (ref >60)
GFR calc non Af Amer: >60 mL/min (ref >60)
GLUCOSE: 99 mg/dL (ref 65–99)
POTASSIUM: 4.2 mmol/L (ref 3.5–5.1)
SODIUM: 137 mmol/L (ref 135–145)
TOTAL PROTEIN: 8.3 g/dL — AB (ref 6.5–8.1)

## 2015-08-05 LAB — LIPASE, BLOOD: Lipase: 31 U/L (ref 11–51)

## 2015-08-05 MED ORDER — OXYCODONE-ACETAMINOPHEN 5-325 MG PO TABS
1.0000 | ORAL_TABLET | Freq: Once | ORAL | Status: AC
  Administered 2015-08-06: 1 via ORAL
  Filled 2015-08-05: qty 1

## 2015-08-05 NOTE — ED Notes (Signed)
Patient ambulatory to triage with steady gait, without difficulty or distress noted; pt reports left lower abd pain x 2wks accomp by nausea

## 2015-08-06 ENCOUNTER — Emergency Department: Payer: Medicaid Other

## 2015-08-06 LAB — CHLAMYDIA/NGC RT PCR (ARMC ONLY)
CHLAMYDIA TR: NOT DETECTED
N GONORRHOEAE: NOT DETECTED

## 2015-08-06 LAB — WET PREP, GENITAL
CLUE CELLS WET PREP: NONE SEEN
Sperm: NONE SEEN
TRICH WET PREP: NONE SEEN

## 2015-08-06 LAB — PREGNANCY, URINE: PREG TEST UR: NEGATIVE

## 2015-08-06 MED ORDER — FLUCONAZOLE 50 MG PO TABS
150.0000 mg | ORAL_TABLET | Freq: Once | ORAL | Status: DC
  Filled 2015-08-06: qty 1

## 2015-08-06 NOTE — ED Provider Notes (Signed)
[facility] Emergency Department Provider Note   ____________________________________________  Time seen: Approximately 12:04 AM  I have reviewed the triage vital signs and the nursing notes.   HISTORY  Chief Complaint Abdominal Pain   HPI Vicki Black is a 25 y.o. female who is a G3 P3 with a history of gonorrhea chlamydia and herpes who is presenting to the emergency department with left lower quadrant pain over the past 2 weeks. Says the pain is been intermittent but constant since 5 PM. Says it feels like a stabbing pain that worsens with movement and sitting. She says that it radiates from her left lower quadrant up to left upper quadrant. Says that she has had nausea but no vomiting. Denies any vaginal bleeding or discharge. Denies any constipation or diarrhea. Says that she was scheduled for an ultrasound on Monday by her primary care doctor for concern for an ovarian cyst but "could not take it anymore." Is that her pain is an 8 out of 10 at this time.   Past Medical History  Diagnosis Date  . Bursitis of hip     trochanteric bursitis of both hips  . Back pain     There are no active problems to display for this patient.   Past Surgical History  Procedure Laterality Date  . Tubal ligation      Current Outpatient Rx  Name  Route  Sig  Dispense  Refill  . DULoxetine (CYMBALTA) 30 MG capsule   Oral   Take 30 mg by mouth daily.         Marland Kitchen. albuterol (PROVENTIL HFA;VENTOLIN HFA) 108 (90 Base) MCG/ACT inhaler   Inhalation   Inhale 1-2 puffs into the lungs every 6 (six) hours as needed for wheezing or shortness of breath.   1 Inhaler   0   . baclofen (LIORESAL) 20 MG tablet   Oral   Take 20 mg by mouth 3 (three) times daily.         . brompheniramine-pseudoephedrine-DM 30-2-10 MG/5ML syrup   Oral   Take 10 mLs by mouth 4 (four) times daily as needed. Patient not taking: Reported on 06/29/2015   200 mL   0   . clonazePAM  (KLONOPIN) 1 MG tablet   Oral   Take 1 mg by mouth 2 (two) times daily.         Marland Kitchen. gabapentin (NEURONTIN) 100 MG capsule   Oral   Take 100 mg by mouth 2 (two) times daily.         . meloxicam (MOBIC) 15 MG tablet   Oral   Take 1 tablet (15 mg total) by mouth daily. Patient not taking: Reported on 06/29/2015   30 tablet   0     Allergies Sulfa antibiotics  No family history on file.  Social History Social History  Substance Use Topics  . Smoking status: Never Smoker   . Smokeless tobacco: None  . 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 ENT: No sore throat. Cardiovascular: Denies chest pain. Respiratory: Denies shortness of breath. Gastrointestinal:  no vomiting.  No diarrhea.  No constipation. Genitourinary: Negative for dysuria. Musculoskeletal: Negative for back pain. Skin: Negative for rash. Neurological: Negative for headaches, focal weakness or  numbness.  10-point ROS otherwise negative.  ____________________________________________   PHYSICAL EXAM:  VITAL SIGNS: ED Triage Vitals  Enc Vitals Group     BP 08/05/15 2212 108/79 mmHg     Pulse Rate 08/05/15 2212 71     Resp 08/05/15 2212 20     Temp 08/05/15 2212 97.7 F (36.5 C)     Temp Source 08/05/15 2212 Oral     SpO2 08/05/15 2212 100 %     Weight 08/05/15 2212 120 lb (54.432 kg)     Height 08/05/15 2212  (1.651 m)     Head Cir --      Peak Flow --      Pain Score 08/05/15 2216 6     Pain Loc --      Pain Edu? --      Excl. in GC? --     Constitutional: Alert and oriented. Well appearing and in no acute distress. Eyes: Conjunctivae are normal. PERRL. EOMI. Head: Atraumatic. Nose: No congestion/rhinnorhea. Mouth/Throat: Mucous membranes are moist.     Neck: No stridor.   Cardiovascular: Normal rate, regular rhythm. Grossly normal heart sounds.   Respiratory: Normal respiratory effort.  No retractions. Lungs CTAB. Gastrointestinal: Soft with mild left lower quadrant tenderness to palpation. No distention.  No CVA tenderness. Genitourinary:  Normal external genital exam. Speculum exam with a small amount of white discharge. Bimanual exam without any cervical motion tenderness. No uterine or right adnexal tenderness palpation. Mild left adnexal tenderness palpation. No masses palpated. Musculoskeletal: No lower extremity tenderness nor edema.  No joint effusions. Neurologic:  Normal speech and language. No gross focal neurologic deficits are appreciated.  Skin:  Skin is warm, dry and intact. No rash noted. Psychiatric: Mood and affect are normal. Speech and behavior are normal.  ____________________________________________   LABS (all labs ordered are listed, but only abnormal results are displayed)  Labs Reviewed  WET PREP, GENITAL - Abnormal; Notable for the following:    Yeast Wet Prep HPF POC PRESENT (*)    WBC, Wet Prep HPF POC MODERATE (*)    All other components within normal limits  COMPREHENSIVE METABOLIC PANEL - Abnormal; Notable for the following:    Total Protein 8.3 (*)    All other components within normal limits  URINALYSIS COMPLETEWITH MICROSCOPIC ([facility] ONLY) - Abnormal; Notable for the following:    Color, Urine YELLOW (*)    APPearance CLEAR (*)    Protein, ur 100 (*)    Bacteria, UA FEW (*)    Squamous Epithelial / LPF 0-5 (*)    All other components within normal limits  CHLAMYDIA/NGC RT PCR ([facility] ONLY)  LIPASE, BLOOD  CBC  PREGNANCY, URINE   ____________________________________________  EKG   ____________________________________________  RADIOLOGY      US Transvaginal Non-OB (Final result) Result time: 08/06/15 02:03:42   Final result by Rad Results In Interface (08/06/15 02:03:42)    Narrative:   CLINICAL DATA: Pelvic pain for 2 weeks.  EXAM: TRANSABDOMINAL AND TRANSVAGINAL ULTRASOUND OF PELVIS  DOPPLER ULTRASOUND OF OVARIES  TECHNIQUE: Both transabdominal and transvaginal ultrasound examinations of the pelvis were performed. Transabdominal technique was performed for global imaging of the pelvis including uterus, ovaries, adnexal regions, and pelvic cul-de-sac.  It was necessary to proceed with endovaginal exam following the transabdominal exam to visualize the ovaries. Color and duplex Doppler ultrasound was utilized to evaluate blood flow to the ovaries.  COMPARISON: None.  FINDINGS: Uterus  Measurements: 7.2 x 4.5 x 5.4 cm.  No fibroids or other mass visualized.  Endometrium  Thickness: 9.1 mm. No focal abnormality visualized. Small volume blood or fluid in the canal.  Right ovary  Measurements: 3.3 x 2.2 x 2.5 cm. Normal appearance/no adnexal mass.  Left ovary  Measurements: 2.4 x 1.7 x 2.0 cm. Normal appearance/no adnexal mass.  Pulsed Doppler evaluation of both ovaries demonstrates normal low-resistance arterial and venous waveforms.  Other findings  No abnormal free fluid.  IMPRESSION: Normal uterus and ovaries.   Electronically Signed By: Ellery Plunk M.D. On: 08/06/2015 02:03          US Pelvis Complete (Final result) Result time: 08/06/15 02:03:42   Final result by Rad Results In Interface (08/06/15 02:03:42)   Narrative:   CLINICAL DATA: Pelvic pain for 2 weeks.  EXAM: TRANSABDOMINAL AND TRANSVAGINAL ULTRASOUND OF PELVIS  DOPPLER ULTRASOUND OF OVARIES  TECHNIQUE: Both transabdominal and transvaginal ultrasound examinations of the pelvis were performed. Transabdominal technique was performed for global imaging of the pelvis including uterus, ovaries, adnexal regions, and pelvic cul-de-sac.  It was necessary to proceed with endovaginal exam following the transabdominal exam to visualize the  ovaries. Color and duplex Doppler ultrasound was utilized to evaluate blood flow to the ovaries.  COMPARISON: None.  FINDINGS: Uterus  Measurements: 7.2 x 4.5 x 5.4 cm. No fibroids or other mass visualized.  Endometrium  Thickness: 9.1 mm. No focal abnormality visualized. Small volume blood or fluid in the canal.  Right ovary  Measurements: 3.3 x 2.2 x 2.5 cm. Normal appearance/no adnexal mass.  Left ovary  Measurements: 2.4 x 1.7 x 2.0 cm. Normal appearance/no adnexal mass.  Pulsed Doppler evaluation of both ovaries demonstrates normal low-resistance arterial and venous waveforms.  Other findings  No abnormal free fluid.  IMPRESSION: Normal uterus and ovaries.   Electronically Signed By: Ellery Plunk M.D. On: 08/06/2015 02:03          [place] Art/Ven Flow Abd Pelv Doppler (Final result) Result time: 08/06/15 02:03:42   Final result by Rad Results In Interface (08/06/15 02:03:42)   Narrative:   CLINICAL DATA: Pelvic pain for 2 weeks.  EXAM: TRANSABDOMINAL AND TRANSVAGINAL ULTRASOUND OF PELVIS  DOPPLER ULTRASOUND OF OVARIES  TECHNIQUE: Both transabdominal and transvaginal ultrasound examinations of the pelvis were performed. Transabdominal technique was performed for global imaging of the pelvis including uterus, ovaries, adnexal regions, and pelvic cul-de-sac.  It was necessary to proceed with endovaginal exam following the transabdominal exam to visualize the ovaries. Color and duplex Doppler ultrasound was utilized to evaluate blood flow to the ovaries.  COMPARISON: None.  FINDINGS: Uterus  Measurements: 7.2 x 4.5 x 5.4 cm. No fibroids or other mass visualized.  Endometrium  Thickness: 9.1 mm. No focal abnormality visualized. Small volume blood or fluid in the canal.  Right ovary  Measurements: 3.3 x 2.2 x 2.5 cm. Normal appearance/no adnexal mass.  Left ovary  Measurements: 2.4 x 1.7 x 2.0 cm. Normal appearance/no  adnexal mass.  Pulsed Doppler evaluation of both ovaries demonstrates normal low-resistance arterial and venous waveforms.  Other findings  No abnormal free fluid.  IMPRESSION: Normal uterus and ovaries.   Electronically Signed By: Ellery Plunk M.D. On: 08/06/2015 02:03    ____________________________________________   PROCEDURES   ____________________________________________   INITIAL IMPRESSION / ASSESSMENT AND PLAN / ED COURSE  Pertinent labs & imaging results that were available during my care of the patient were reviewed by me and considered in my medical decision making (see chart for details).  -----------------------------------------  2:58 AM on 08/06/2015 -----------------------------------------  Patient with no outward signs of distress. Reexamined the abdomen and appears nontender. Very soft. The patient still says that she is in pain. However, she does not wince or guard when I palpate. There are no masses palpated. Unclear cause of the patient's pain. I asked her if there were any strenuous events or injuries to the area and she said no. She says that she has muscle relaxers at home and will begin trying them for this pain control.  She will also try muscle cream such as Aspercreme or icy hot. We'll also use a heating pad. Hasn't appointment for follow-up with her primary care doctor tomorrow. No vomiting throughout her visit in the emergency department. ____________________________________________   FINAL CLINICAL IMPRESSION(S) / ED DIAGNOSES  Final diagnoses:  Pelvic pain in female      NEW MEDICATIONS STARTED DURING THIS VISIT:  New Prescriptions   No medications on file     Note:  This document was prepared using Dragon voice recognition software and may include unintentional dictation errors.    Myrna Blazer, MD 08/06/15 [phone]

## 2015-08-06 NOTE — ED Notes (Signed)
Patient transported to Ultrasound 

## 2015-08-06 NOTE — ED Notes (Signed)
Called lab for urine preg. Results, spoke with Verdon CumminsJesse, states will run it.

## 2015-08-06 NOTE — Discharge Instructions (Signed)

## 2015-08-06 NOTE — ED Notes (Signed)
Pt left left prior to taking diflucan, also left discharge instructions.

## 2015-08-09 ENCOUNTER — Ambulatory Visit: Payer: Medicaid Other

## 2015-08-20 ENCOUNTER — Telehealth: Payer: Self-pay | Admitting: Dietician

## 2015-08-20 NOTE — Telephone Encounter (Signed)
Called and spoke with Vicki Black regarding rescheduling her medical nutrition therapy follow-up appointment. She asked if she could call me back later.

## 2015-09-02 ENCOUNTER — Encounter: Payer: Self-pay | Admitting: Dietician

## 2015-09-17 ENCOUNTER — Other Ambulatory Visit: Payer: Self-pay | Admitting: Family Medicine

## 2015-09-17 DIAGNOSIS — R109 Unspecified abdominal pain: Secondary | ICD-10-CM

## 2015-09-22 ENCOUNTER — Ambulatory Visit: Admission: RE | Admit: 2015-09-22 | Payer: Medicaid Other | Source: Ambulatory Visit

## 2016-01-04 ENCOUNTER — Emergency Department
Admission: EM | Admit: 2016-01-04 | Discharge: 2016-01-05 | Disposition: A | Discharge status: Other Acute Inpatient Hospital | Payer: Medicaid Other | Attending: Emergency Medicine | Admitting: Emergency Medicine

## 2016-01-04 ENCOUNTER — Encounter: Payer: Self-pay | Admitting: Emergency Medicine

## 2016-01-04 DIAGNOSIS — F1210 Cannabis abuse, uncomplicated: Secondary | ICD-10-CM

## 2016-01-04 DIAGNOSIS — F32A Depression, unspecified: Secondary | ICD-10-CM

## 2016-01-04 DIAGNOSIS — F419 Anxiety disorder, unspecified: Secondary | ICD-10-CM | POA: Insufficient documentation

## 2016-01-04 DIAGNOSIS — Z79899 Other long term (current) drug therapy: Secondary | ICD-10-CM | POA: Diagnosis not present

## 2016-01-04 DIAGNOSIS — F329 Major depressive disorder, single episode, unspecified: Secondary | ICD-10-CM | POA: Insufficient documentation

## 2016-01-04 DIAGNOSIS — F332 Major depressive disorder, recurrent severe without psychotic features: Secondary | ICD-10-CM | POA: Diagnosis not present

## 2016-01-04 DIAGNOSIS — R45851 Suicidal ideations: Secondary | ICD-10-CM | POA: Diagnosis present

## 2016-01-04 DIAGNOSIS — F1011 Alcohol abuse, in remission: Secondary | ICD-10-CM

## 2016-01-04 LAB — CBC
HEMATOCRIT: 40.2 % (ref 35.0–47.0)
HEMOGLOBIN: 13.5 g/dL (ref 12.0–16.0)
MCH: 31 pg (ref 26.0–34.0)
MCHC: 33.6 g/dL (ref 32.0–36.0)
MCV: 92.2 fL (ref 80.0–100.0)
Platelets: 245 10*3/uL (ref 150–440)
RBC: 4.36 MIL/uL (ref 3.80–5.20)
RDW: 12.9 % (ref 11.5–14.5)
WBC: 8.4 10*3/uL (ref 3.6–11.0)

## 2016-01-04 LAB — URINE DRUG SCREEN, QUALITATIVE (ARMC ONLY)
AMPHETAMINES, UR SCREEN: NOT DETECTED
Barbiturates, Ur Screen: NOT DETECTED
Benzodiazepine, Ur Scrn: POSITIVE — AB
COCAINE METABOLITE, UR ~~LOC~~: NOT DETECTED
Cannabinoid 50 Ng, Ur ~~LOC~~: POSITIVE — AB
MDMA (Ecstasy)Ur Screen: NOT DETECTED
METHADONE SCREEN, URINE: NOT DETECTED
OPIATE, UR SCREEN: NOT DETECTED
PHENCYCLIDINE (PCP) UR S: NOT DETECTED
Tricyclic, Ur Screen: NOT DETECTED

## 2016-01-04 LAB — COMPREHENSIVE METABOLIC PANEL
ALK PHOS: 29 U/L — AB (ref 38–126)
ALT: 10 U/L — ABNORMAL LOW (ref 14–54)
ANION GAP: 5 (ref 5–15)
AST: 15 U/L (ref 15–41)
Albumin: 4.3 g/dL (ref 3.5–5.0)
BILIRUBIN TOTAL: 0.6 mg/dL (ref 0.3–1.2)
BUN: 19 mg/dL (ref 6–20)
CALCIUM: 9 mg/dL (ref 8.9–10.3)
CO2: 25 mmol/L (ref 22–32)
Chloride: 106 mmol/L (ref 101–111)
Creatinine, Ser: 0.75 mg/dL (ref 0.44–1.00)
GFR calc Af Amer: >60 mL/min (ref >60)
GFR calc non Af Amer: >60 mL/min (ref >60)
GLUCOSE: 91 mg/dL (ref 65–99)
Potassium: 3.6 mmol/L (ref 3.5–5.1)
Sodium: 136 mmol/L (ref 135–145)
Total Protein: 7.3 g/dL (ref 6.5–8.1)

## 2016-01-04 LAB — ACETAMINOPHEN LEVEL

## 2016-01-04 LAB — ETHANOL: Alcohol, Ethyl (B): <5 mg/dL (ref <5)

## 2016-01-04 LAB — SALICYLATE LEVEL: Salicylate Lvl: <4 mg/dL (ref 2.8–30.0)

## 2016-01-04 NOTE — ED Notes (Signed)
Pt dressed out into burgundy scrubs. Pt has two bags. Bag 1 of 2 consist of clothing and shoes. Bag 2 of 2 is a burgundy duffle bag with belongings inside.

## 2016-01-04 NOTE — ED Notes (Signed)
Pt being seen by Dr. Derrill KayGoodMan

## 2016-01-04 NOTE — ED Notes (Signed)
Pt denies SI/HI at this time, states just feeling depressed.
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
## 2016-01-04 NOTE — ED Provider Notes (Signed)
[facility] Emergency Department Provider Note    ____________________________________________   I have reviewed the triage vital signs and the nursing notes.   HISTORY  Chief Complaint Mental Health Problem   History limited by: Not Limited   HPI Vicki Black is a 25 y.o. female who presents to the emergency department today at the request of [facility] for evaluation of depression and anxiety. Patient states that she has been having depression and anxiety for months. She states that she was going to come last night but cannot get a ride. Went to [employer] day. She has seen therapist in the past and states she's been tried on multiple pressure medications however is not on any currently. Patient also has concerns for anxiety. Patient states that she has had some thoughts about how would be better if she was not here although denies any plan for self-harm. Denies any current medical complaints.   Past Medical History:  Diagnosis Date  . Back pain   . Bursitis of hip    trochanteric bursitis of both hips    There are no active problems to display for this patient.   Past Surgical History:  Procedure Laterality Date  . TUBAL LIGATION      Prior to Admission medications   Medication Sig Start Date End Date Taking? Authorizing Provider  albuterol (PROVENTIL HFA;VENTOLIN HFA) 108 (90 Base) MCG/ACT inhaler Inhale 1-2 puffs into the lungs every 6 (six) hours as needed for wheezing or shortness of breath. 05/19/15   Jami L Hagler, PA-C  baclofen (LIORESAL) 20 MG tablet Take 20 mg by mouth 3 (three) times daily.    Historical Provider, MD  brompheniramine-pseudoephedrine-DM 30-2-10 MG/5ML syrup Take 10 mLs by mouth 4 (four) times daily as needed. Patient not taking: Reported on 06/29/2015 05/19/15   Jami L Hagler, PA-C  clonazePAM (KLONOPIN) 1 MG tablet Take 1 mg by mouth 2 (two) times daily.    Historical Provider, MD  DULoxetine (CYMBALTA) 30 MG capsule  Take 30 mg by mouth daily.    Historical Provider, MD  gabapentin (NEURONTIN) 100 MG capsule Take 100 mg by mouth 2 (two) times daily.    Historical Provider, MD  meloxicam (MOBIC) 15 MG tablet Take 1 tablet (15 mg total) by mouth daily. Patient not taking: Reported on 06/29/2015 05/13/15   Jami L Hagler, PA-C    Allergies Sulfa antibiotics  No family history on file.  Social History Social History  Substance Use Topics  . Smoking status: Never Smoker  . Smokeless tobacco: Never Used  . 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 Cardiovascular: Negative for chest pain. Respiratory: Negative for shortness of breath. Gastrointestinal: Negative for abdominal pain, vomiting and diarrhea. Neurological: Negative for headaches, focal weakness or numbness.  10-point ROS otherwise negative.  ____________________________________________   PHYSICAL EXAM:  VITAL SIGNS: ED Triage Vitals  Enc Vitals Group     BP 01/04/16 1813 109/63     Pulse Rate 01/04/16 1813 98     Resp 01/04/16 1813 17     Temp 01/04/16 1813 98.6 F (37 C)     Temp Source 01/04/16 1813 Oral     SpO2 01/04/16 1813 97 %     Weight 01/04/16 1813 121 lb (54.9  kg)     Height 01/04/16 1813 5\' 5"  (1.651 m)     Head Circumference --      Peak Flow --      Pain Score 01/04/16 1831 0   Constitutional: Alert and oriented. Well appearing and in no distress. Eyes: Conjunctivae are normal. Normal extraocular movements. ENT   Head: Normocephalic and atraumatic.   Nose: No congestion/rhinnorhea.   Mouth/Throat: Mucous membranes are moist.   Neck: No stridor. Hematological/Lymphatic/Immunilogical: No cervical lymphadenopathy. Cardiovascular: Normal rate, regular rhythm.  No murmurs, rubs, or  gallops. Respiratory: Normal respiratory effort without tachypnea nor retractions. Breath sounds are clear and equal bilaterally. No wheezes/rales/rhonchi. Gastrointestinal: Soft and nontender. No distention.  Genitourinary: Deferred Musculoskeletal: Normal range of motion in all extremities. No lower extremity edema. Neurologic:  Normal speech and language. No gross focal neurologic deficits are appreciated.  Skin:  Skin is warm, dry and intact. No rash noted. Psychiatric: Slightly depressed.   ____________________________________________    LABS (pertinent positives/negatives)  Labs Reviewed  COMPREHENSIVE METABOLIC PANEL - Abnormal; Notable for the following:       Result Value   ALT 10 (*)    Alkaline Phosphatase 29 (*)    All other components within normal limits  ACETAMINOPHEN LEVEL - Abnormal; Notable for the following:    Acetaminophen (Tylenol), Serum <10 (*)    All other components within normal limits  URINE DRUG SCREEN, QUALITATIVE ([facility] ONLY) - Abnormal; Notable for the following:    Cannabinoid 50 Ng, Ur [place] POSITIVE (*)    Benzodiazepine, Ur Scrn POSITIVE (*)    All other components within normal limits  ETHANOL  SALICYLATE LEVEL  CBC     ____________________________________________   EKG  None  ____________________________________________    RADIOLOGY  None  ____________________________________________   PROCEDURES  Procedures  ____________________________________________   INITIAL IMPRESSION / ASSESSMENT AND PLAN / ED COURSE  Pertinent labs & imaging results that were available during my care of the patient were reviewed by me and considered in my medical decision making (see chart for details).  Patient presented to the emergency department today because of concerns for depression and anxiety. No SI. Passive thoughts of no longer wanting to be here. Will have psychiatry evaluate in the  morning.   ____________________________________________   FINAL CLINICAL IMPRESSION(S) / ED DIAGNOSES  Depression Anxiety  Note: This dictation was prepared with Dragon dictation. Any transcriptional errors that result from this process are unintentional    Phineas SemenGraydon Haidar Muse, MD 01/04/16 2349

## 2016-01-04 NOTE — ED Notes (Signed)
Pt given meal tray and sprite at this time 

## 2016-01-05 ENCOUNTER — Encounter: Payer: Self-pay | Admitting: Psychiatry

## 2016-01-05 ENCOUNTER — Inpatient Hospital Stay
Admission: RE | Admit: 2016-01-05 | Discharge: 2016-01-07 | DRG: 885 | Disposition: A | Discharge status: Home / Self Care | Payer: Medicaid Other | Source: Intra-hospital | Attending: Psychiatry | Admitting: Psychiatry

## 2016-01-05 DIAGNOSIS — Z818 Family history of other mental and behavioral disorders: Secondary | ICD-10-CM

## 2016-01-05 DIAGNOSIS — F1011 Alcohol abuse, in remission: Secondary | ICD-10-CM | POA: Diagnosis present

## 2016-01-05 DIAGNOSIS — F329 Major depressive disorder, single episode, unspecified: Secondary | ICD-10-CM | POA: Diagnosis not present

## 2016-01-05 DIAGNOSIS — F332 Major depressive disorder, recurrent severe without psychotic features: Secondary | ICD-10-CM | POA: Diagnosis present

## 2016-01-05 DIAGNOSIS — M549 Dorsalgia, unspecified: Secondary | ICD-10-CM | POA: Diagnosis present

## 2016-01-05 DIAGNOSIS — G8929 Other chronic pain: Secondary | ICD-10-CM | POA: Diagnosis present

## 2016-01-05 DIAGNOSIS — F5105 Insomnia due to other mental disorder: Secondary | ICD-10-CM | POA: Diagnosis present

## 2016-01-05 DIAGNOSIS — F1210 Cannabis abuse, uncomplicated: Secondary | ICD-10-CM

## 2016-01-05 DIAGNOSIS — Z79899 Other long term (current) drug therapy: Secondary | ICD-10-CM | POA: Diagnosis not present

## 2016-01-05 DIAGNOSIS — Z882 Allergy status to sulfonamides status: Secondary | ICD-10-CM | POA: Diagnosis not present

## 2016-01-05 DIAGNOSIS — Z590 Homelessness: Secondary | ICD-10-CM | POA: Diagnosis not present

## 2016-01-05 DIAGNOSIS — F410 Panic disorder [episodic paroxysmal anxiety] without agoraphobia: Secondary | ICD-10-CM | POA: Diagnosis present

## 2016-01-05 LAB — URINALYSIS COMPLETE WITH MICROSCOPIC (ARMC ONLY)
BACTERIA UA: NONE SEEN
Bilirubin Urine: NEGATIVE
Glucose, UA: NEGATIVE mg/dL
Hgb urine dipstick: NEGATIVE
Ketones, ur: NEGATIVE mg/dL
Leukocytes, UA: NEGATIVE
Nitrite: NEGATIVE
PROTEIN: NEGATIVE mg/dL
RBC / HPF: NONE SEEN RBC/hpf (ref 0–5)
Specific Gravity, Urine: 1.023 (ref 1.005–1.030)
pH: 7 (ref 5.0–8.0)

## 2016-01-05 MED ORDER — CITALOPRAM HYDROBROMIDE 20 MG PO TABS
10.0000 mg | ORAL_TABLET | Freq: Every day | ORAL | Status: DC
  Filled 2016-01-05: qty 1

## 2016-01-05 MED ORDER — CLONAZEPAM 0.5 MG PO TABS
0.5000 mg | ORAL_TABLET | ORAL | Status: DC
  Administered 2016-01-05: 0.5 mg via ORAL
  Filled 2016-01-05: qty 1

## 2016-01-05 MED ORDER — IBUPROFEN 800 MG PO TABS
800.0000 mg | ORAL_TABLET | Freq: Once | ORAL | Status: AC
  Administered 2016-01-05: 800 mg via ORAL
  Filled 2016-01-05: qty 1

## 2016-01-05 MED ORDER — MELOXICAM 7.5 MG PO TABS
15.0000 mg | ORAL_TABLET | Freq: Every day | ORAL | Status: DC | PRN
  Administered 2016-01-06 – 2016-01-07 (×2): 15 mg via ORAL
  Filled 2016-01-05 (×2): qty 2

## 2016-01-05 MED ORDER — MAGNESIUM HYDROXIDE 400 MG/5ML PO SUSP: 30.0000 mL | Freq: Every day | ORAL | Status: DC | PRN

## 2016-01-05 MED ORDER — QUETIAPINE FUMARATE 25 MG PO TABS
50.0000 mg | ORAL_TABLET | Freq: Every day | ORAL | Status: DC
  Administered 2016-01-05: 50 mg via ORAL

## 2016-01-05 MED ORDER — QUETIAPINE FUMARATE 25 MG PO TABS: 50.0000 mg | ORAL_TABLET | Freq: Every day | ORAL | Status: DC

## 2016-01-05 MED ORDER — ACETAMINOPHEN 325 MG PO TABS: 650.0000 mg | ORAL_TABLET | Freq: Four times a day (QID) | ORAL | Status: DC | PRN

## 2016-01-05 MED ORDER — CITALOPRAM HYDROBROMIDE 20 MG PO TABS
10.0000 mg | ORAL_TABLET | Freq: Every day | ORAL | Status: DC
  Administered 2016-01-06: 10 mg via ORAL
  Filled 2016-01-05: qty 1

## 2016-01-05 MED ORDER — TRAZODONE HCL 100 MG PO TABS
100.0000 mg | ORAL_TABLET | Freq: Every day | ORAL | Status: DC
  Administered 2016-01-05: 100 mg via ORAL
  Filled 2016-01-05: qty 1

## 2016-01-05 MED ORDER — CLONAZEPAM 0.5 MG PO TABS: 0.5000 mg | ORAL_TABLET | ORAL | Status: DC

## 2016-01-05 MED ORDER — QUETIAPINE FUMARATE 25 MG PO TABS
ORAL_TABLET | ORAL | Status: AC
  Administered 2016-01-05: 50 mg via ORAL
  Filled 2016-01-05: qty 2

## 2016-01-05 MED ORDER — ACETAMINOPHEN 500 MG PO TABS
1000.0000 mg | ORAL_TABLET | Freq: Four times a day (QID) | ORAL | Status: DC | PRN
  Administered 2016-01-06 (×2): 1000 mg via ORAL
  Filled 2016-01-05 (×2): qty 2

## 2016-01-05 MED ORDER — ALUM & MAG HYDROXIDE-SIMETH 200-200-20 MG/5ML PO SUSP: 30.0000 mL | ORAL | Status: DC | PRN

## 2016-01-05 MED ORDER — HYDROXYZINE HCL 50 MG PO TABS
50.0000 mg | ORAL_TABLET | Freq: Three times a day (TID) | ORAL | Status: DC | PRN
  Administered 2016-01-05: 50 mg via ORAL
  Filled 2016-01-05: qty 1

## 2016-01-05 NOTE — ED Notes (Signed)
Pt expressed concern over getting her home meds , I explained the process.she appears in no distress

## 2016-01-05 NOTE — ED Notes (Signed)
Pt seen by Dr Toni Amendlapacs pt is to be admitted as a voluntary pt , pt is agreeable with this plan ,she is now in the shower

## 2016-01-05 NOTE — BH Assessment (Signed)
Assessment Note  Vicki Black is an 25 y.o. female presenting to the ED, voluntarily, at the request of [name], for evaluation of depression and anxiety. Patient states that she has been having depression and anxiety for months. She has seen therapist in the past and states she's been tried on multiple pressure medications however is not on any currently. Patient also has concerns for anxiety. Patient reports she has had suicidal thoughts in the past but would not act on them because she has children.    Pt denies HI and any auditory/visual hallucinations.  PT UDS + for benzos and cannabis.  Diagnosis: Depression  Past Medical History:  Past Medical History:  Diagnosis Date  . Back pain   . Bursitis of hip    trochanteric bursitis of both hips    Past Surgical History:  Procedure Laterality Date  . TUBAL LIGATION      Family History: No family history on file.  Social History:  reports that she has never smoked. She has never used smokeless tobacco. She reports that she drinks about 6.0 oz of alcohol per week . She reports that she does not use drugs.  Additional Social History:  Alcohol / Drug Use History of alcohol / drug use?: No history of alcohol / drug abuse  CIWA: CIWA-Ar BP: (!) 96/53 Pulse Rate: 88 COWS:    Allergies:  Allergies  Allergen Reactions  . Sulfa Antibiotics     Home Medications:  (Not in a hospital admission)  OB/GYN Status:  Patient's last menstrual period was 01/01/2016.  General Assessment Data [facility] Assessment: In system Is this a Tele or Face-to-Face Assessment?: Face-to-Face Is this an Initial Assessment or a Re-assessment for this encounter?: Initial Assessment Marital status: Single Maiden name: n/a Is patient pregnant?: No Pregnancy Status: No Living Arrangements: Spouse/significant other, Children Can pt return to current living arrangement?: Yes Admission Status: Voluntary Is  patient capable of signing voluntary admission?: Yes Referral Source: Other Insurance type: Medicaid  Medical Screening Exam [facility] Walk-in ONLY) Medical Exam completed: Yes  Crisis Care Plan Living Arrangements: Spouse/significant other, Children Legal Guardian: Other: (self) [facility]rist[facility] Therapist: [facility]  Education Status Is patient currently in school?: No Current Grade: n/a Highest grade of school patient has completed: n/a Name of school: n/a Contact person: n/a  Risk to self with the past 6 months Suicidal Ideation: Yes-Currently Present Has patient been a risk to self within the past 6 months prior to admission? : No Suicidal Intent: No Has patient had any suicidal intent within the past 6 months prior to admission? : No Is patient at risk for suicide?: No Suicidal Plan?: No Has patient had any suicidal plan within the past 6 months prior to admission? : No Access to Means: No What has been your use of drugs/alcohol within the last 12 months?: Benzos and cannabis Previous Attempts/Gestures: No How many times?: 0 Other Self Harm Risks: none identified Triggers for Past Attempts: Unknown Intentional Self Injurious Behavior: None Family Suicide History: No Recent stressful life event(s): Financial Problems Persecutory voices/beliefs?: No Depression: Yes Depression Symptoms: Loss of interest in usual pleasures, Feeling worthless/self pity Substance abuse history and/or treatment for substance abuse?: Yes Suicide prevention information given to non-admitted patients: Not applicable  Risk to Others within the past 6 months Homicidal Ideation: No Does patient have any lifetime risk of violence toward others beyond the six months prior to admission? : No Thoughts of  Harm to Others: No Current Homicidal Intent: No Current Homicidal Plan: No Access to Homicidal Means: No Identified Victim: None identified History of harm to others?:  No Assessment of Violence: None Noted Violent Behavior Description: None identified Does patient have access to weapons?: No Criminal Charges Pending?: No Does patient have a court date: No Is patient on probation?: No  Psychosis Hallucinations: None noted Delusions: None noted  Mental Status Report Appearance/Hygiene: In scrubs Eye Contact: Fair Motor Activity: Freedom of movement Speech: Soft Level of Consciousness: Alert Mood: Anxious, Depressed Affect: Appropriate to circumstance, Anxious Anxiety Level: Minimal Thought Processes: Relevant, Coherent Judgement: Partial Orientation: Person, Place, Time, Situation Obsessive Compulsive Thoughts/Behaviors: None  Cognitive Functioning Concentration: Normal Memory: Recent Intact, Remote Intact IQ: Average Insight: Fair Impulse Control: Fair Appetite: Good Weight Loss: 0 Weight Gain: 0 Sleep: No Change Total Hours of Sleep: 6 Vegetative Symptoms: None  ADLScreening [facility] Assessment Services) Patient's cognitive ability adequate to safely complete daily activities?: Yes Patient able to express need for assistance with ADLs?: Yes Independently performs ADLs?: Yes (appropriate for developmental age)  Prior Inpatient Therapy Prior Inpatient Therapy: No Prior Therapy Dates: n/a Prior Therapy Facilty/Provider(s): n/a Reason for Treatment: n/a  Prior Outpatient Therapy Prior Outpatient Therapy: Yes Prior Therapy Dates: current Prior Therapy [facility]ilty/Provider([facility]): [facility] Reason for Treatment: depression, anxiety Does patient have an ACCT team?: No Does patient have Intensive In-House Services?  : No Does patient have [facility] services? : No Does patient have P4CC services?: No  ADL Screening (condition at time of admission) Patient's cognitive ability adequate to safely complete daily activities?: Yes Patient able to express need for assistance with ADLs?: Yes Independently performs ADLs?: Yes (appropriate for  developmental age)       Abuse/Neglect Assessment (Assessment to be complete while patient is alone) Physical Abuse: Denies Verbal Abuse: Denies Sexual Abuse: Denies Exploitation of patient/patient's resources: Denies Self-Neglect: Denies Values / Beliefs Cultural Requests During Hospitalization: None Spiritual Requests During Hospitalization: None Consults Spiritual Care Consult Needed: No Social Work Consult Needed: No Merchant navy officer (For Healthcare) Does patient have an advance directive?: No Would patient like information on creating an advanced directive?: Yes English as a second language teacher given    Additional Information 1:1 In Past 12 Months?: No CIRT Risk: No Elopement Risk: No Does patient have medical clearance?: Yes     Disposition:  Disposition Initial Assessment Completed for this Encounter: Yes Disposition of Patient: Other dispositions Other disposition(s): Other (Comment) (Pending Psych MD consult)  On Site Evaluation by:   Reviewed with Physician:    [place] 01/05/2016 6:12 AM

## 2016-01-05 NOTE — Plan of Care (Signed)
Problem: Safety: Goal: Ability to remain free from injury will improve Outcome: Progressing Educated on [place]

## 2016-01-05 NOTE — ED Notes (Addendum)
Pt became anxious over talking with mom about child custody issues .was medicated previously

## 2016-01-05 NOTE — Tx Team (Signed)
Initial Treatment Plan 01/05/2016 4:35 PM Vicki Harland GermanN Goley ZOX:[id number]    PATIENT STRESSORS: Financial difficulties Marital or family conflict Medication change or noncompliance   PATIENT STRENGTHS: Ability for insight Average or above average intelligence Capable of independent living Communication skills Supportive family/friends   PATIENT IDENTIFIED PROBLEMS: Depression 01/05/16  Anxiety 01/05/16                   DISCHARGE CRITERIA:  Ability to meet basic life and health needs Improved stabilization in mood, thinking, and/or behavior  PRELIMINARY DISCHARGE PLAN: Outpatient therapy Return to previous living arrangement  PATIENT/FAMILY INVOLVEMENT: This treatment plan has been presented to and reviewed with the patient, Vicki Black, and/or family member, .  The patient and family have been given the opportunity to ask questions and make suggestions.  Crist InfanteGwen A Wes Lezotte, RN 01/05/2016, 4:35 PM
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
## 2016-01-05 NOTE — ED Notes (Signed)
Adm in no distress via wheel chair voluntary adm to beh med

## 2016-01-05 NOTE — ED Notes (Addendum)
Pt transferred over from main ed , with c/o depression which is becoming worse over past several weeks she feels her anti depressant is not working she is currently homeless. Pt is pleasant and cooperative, BHU process explained pt verbalized understanding
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
## 2016-01-05 NOTE — ED Notes (Signed)
Pt woke up and inquiring about disposition. [facility]. Pt verbalize understanding.

## 2016-01-05 NOTE — ED Notes (Signed)
Pt also started menses

## 2016-01-05 NOTE — Plan of Care (Signed)
Problem: Education: Goal: Knowledge of [place] [facility] Education information/materials will improve Outcome: Not Progressing New admission  Adapting  To unit

## 2016-01-05 NOTE — BH Assessment (Signed)
[facility] by Dr. Toni Amendlapacs.  Attending Physician will be Dr. Ardyth HarpsHernandez.   Patient [facility] Victorino DikeJennifer.   Intake Paper Work has been signed and placed on patient chart.  ER staff is aware of the admission (Dr. Inocencio HomesGayle, ER MD; Windell Mouldinguth, Patient's Nurse & Legrand Ramsasmine, Patient Access).

## 2016-01-06 ENCOUNTER — Encounter: Payer: Self-pay | Admitting: Psychiatry

## 2016-01-06 DIAGNOSIS — F332 Major depressive disorder, recurrent severe without psychotic features: Principal | ICD-10-CM

## 2016-01-06 LAB — TSH: TSH: 1.452 u[IU]/mL (ref 0.350–4.500)

## 2016-01-06 MED ORDER — MIRTAZAPINE 15 MG PO TABS: 15.0000 mg | ORAL_TABLET | Freq: Every day | ORAL | 0 refills | Status: DC

## 2016-01-06 MED ORDER — MIRTAZAPINE 15 MG PO TABS
15.0000 mg | ORAL_TABLET | Freq: Every day | ORAL | Status: DC
  Administered 2016-01-06: 15 mg via ORAL
  Filled 2016-01-06: qty 1

## 2016-01-06 MED ORDER — SERTRALINE HCL 25 MG PO TABS
12.5000 mg | ORAL_TABLET | Freq: Every day | ORAL | Status: DC
Start: 2016-01-06 — End: 2016-01-07
  Administered 2016-01-07: 12.5 mg via ORAL
  Filled 2016-01-06: qty 1

## 2016-01-06 MED ORDER — HYDROXYZINE HCL 25 MG PO TABS
25.0000 mg | ORAL_TABLET | Freq: Three times a day (TID) | ORAL | Status: DC | PRN
  Administered 2016-01-06: 25 mg via ORAL
  Filled 2016-01-06: qty 1

## 2016-01-06 MED ORDER — MELOXICAM 15 MG PO TABS: 15.0000 mg | ORAL_TABLET | Freq: Every day | ORAL | 0 refills | Status: DC | PRN

## 2016-01-06 MED ORDER — SERTRALINE HCL 25 MG PO TABS
25.0000 mg | ORAL_TABLET | Freq: Every day | ORAL | 0 refills | Status: DC
Start: 2016-01-07 — End: 2016-07-06
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
## 2016-01-06 NOTE — [facility] Group Notes (Signed)
[facility] Group Notes:  (Nursing/MHT/Case Management/Adjunct)  Date:  01/06/2016  Time:  2:47 AM  Type of Therapy:  Group Therapy  Participation Level:  Active  Participation Quality:  Appropriate  Affect:  Appropriate  Cognitive:  Appropriate  Insight:  Appropriate  Engagement in Group:  Engaged  Modes of Intervention:  n/a  Summary of Progress/Problems:  Vicki Black 01/06/2016, 2:47 AM

## 2016-01-06 NOTE — Progress Notes (Signed)
Patient presents with irritable affect. Complaints of pain to head, then complaints of pain in back. Denies SI, HI, AVH. Isolates to self and room. Comes out for meds. Encouragement and support offered. Medications given as prescribed with no relief. Md notified. Safety checks remain, Pt receptive and remains safe on unit with q 15 min checks.

## 2016-01-06 NOTE — Plan of Care (Signed)
Problem: Pain Managment: Goal: General experience of comfort will improve Outcome: Not Progressing Patient continues to endorse pain to head and back. Medications given with no relief. Prn meds given with no  Relief.
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
## 2016-01-06 NOTE — Plan of Care (Signed)
Problem: Safety: Goal: Ability to remain free from injury will improve Outcome: Progressing  Trazodone 100 mg given as scheduled at bedtime, Hydroxyzine 50 mg PO PRN given for anxiety, 15 minute checks maintained for safety, clinical and moral support provided, patient encouraged to continue to express feelings and demonstrate safe care. Patient remains free from harm, will continue to monitor.
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
## 2016-01-06 NOTE — [facility] Suicide Risk Assessment (Signed)
[facility]   Nursing information obtained from:    Demographic factors:    Current Mental Status:    Loss Factors:    Historical Factors:    Risk Reduction Factors:     Total Time spent with patient: 1 hour Principal Problem: Severe recurrent major depression without psychotic features [facility](HCC) Diagnosis:   Patient Active Problem List   Diagnosis Date Noted  . Severe recurrent major depression without psychotic features (HCC) [F33.2] 01/05/2016  . Cannabis abuse [F12.10] 01/05/2016  . 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  World Science writerHealth Organization [facility](WHO). Score between 0-7:  no or low risk or alcohol related problems. Score between 8-15:  moderate risk of alcohol related problems. Score between 16-19:  high risk of alcohol related problems. Score 20 or above:  warrants further diagnostic evaluation for alcohol dependence and treatment.   CLINICAL FACTORS:   Depression:   Comorbid alcohol abuse/dependence Insomnia Alcohol/Substance Abuse/Dependencies More than one psychiatric diagnosis Previous Psychiatric Diagnoses and Treatments   Musculoskeletal:   Psychiatric Specialty Exam: Physical Exam  ROS  Blood pressure (!) 98/54, pulse 88, temperature 98.7 F (37.1 C), temperature source Oral, resp. rate 18, height 5\' 5"  (1.651 m), weight 55.8 kg (123 lb), last menstrual period 01/01/2016, SpO2 100 %.Body mass index is 20.47 kg/m.                                                    Sleep:  Number of Hours: 7.3      COGNITIVE FEATURES THAT CONTRIBUTE TO RISK:  None    SUICIDE RISK:   Mild:  Suicidal ideation of limited frequency, intensity, duration, and specificity.  There are no  identifiable plans, no associated intent, mild dysphoria and related symptoms, good self-control (both objective and subjective assessment), few other risk factors, and identifiable protective factors, including available and accessible social support.   [facility]  I certify that inpatient services furnished can reasonably be expected to improve the patient's condition.  Jimmy FootmanHernandez-Gonzalez,  Jadden Yim, MD 01/06/2016, 10:07 AM

## 2016-01-06 NOTE — [facility] Group Notes (Signed)
[facility] Group Notes:  (Nursing/MHT/Case Management/Adjunct)  Date:  01/06/2016  Time:  4:03 PM  Type of Therapy:  Psychoeducational Skills  Participation Level:  Did Not Attend   Noelle PennerKristen J Randale Carvalho 01/06/2016, 4:03 PM

## 2016-01-06 NOTE — [facility] Group Notes (Signed)
Goals Group  Date/Time: 01/06/2016 9am  Type of Therapy and Topic: Group Therapy: Goals Group: SMART Goals   Pt was called, but did not attend   Gearald Stonebraker F. Keirah Konitzer, LCSWA, LCAS  

## 2016-01-06 NOTE — [facility] Suicide Risk Assessment (Signed)
[facility] INPATIENT:  Family/Significant Other Suicide Prevention Education  Suicide Prevention Education:  Education Completed; Nicholaus BloomLynn Blythe (grandmother [phone]), has been identified by the patient as the family member/significant other with whom the patient will be residing, and identified as the person(s) who will aid the patient in the event of a mental health crisis (suicidal ideations/suicide attempt).  With written consent from the patient, the family member/significant other has been provided the following suicide prevention education, prior to the and/or following the discharge of the patient.  The suicide prevention education provided includes the following:  Suicide risk factors  Suicide prevention and interventions  National Suicide Hotline telephone number  [facility] assessment telephone number  [facility] Emergency Assistance 911  [facility] and/or Residential Mobile Crisis Unit telephone number  Request made of family/significant other to:  Remove weapons (e.g., guns, rifles, knives), all items previously/currently identified as safety concern.    Remove drugs/medications (over-the-counter, prescriptions, illicit drugs), all items previously/currently identified as a safety concern.  The family member/significant other verbalizes understanding of the suicide prevention education information provided.  The family member/significant other agrees to remove the items of safety concern listed above.  Chapin Arduini G. Garnette CzechSampson MSW, LCSWA 01/06/2016, 11:10 AM

## 2016-01-06 NOTE — [facility] Group Notes (Signed)
[facility] Group Notes:  (Nursing/MHT/Case Management/Adjunct)  Date:  01/06/2016  Time:  11:24 PM  Type of Therapy:  Psychoeducational Skills  Participation Level:  Active  Participation Quality:  Appropriate and Sharing  Affect:  Appropriate  Cognitive:  Appropriate  Insight:  Appropriate and Good  Engagement in Group:  Engaged  Modes of Intervention:  Discussion, Socialization and Support  Summary of Progress/Problems:  Chancy MilroyLaquanda Y Jolita Haefner 01/06/2016, 11:24 PM

## 2016-01-06 NOTE — [facility] Group Notes (Signed)
[facility] LCSW Group Therapy   01/06/2016  9:30am  Type of Therapy: Group Therapy   Participation Level: Did Not Attend. Patient invited to participate but declined.    Camora Tremain F. Nkenge Sonntag, MSW, LCSWA, LCAS     

## 2016-01-07 NOTE — Plan of Care (Signed)
Problem: Coping: Goal: Ability to verbalize frustrations and anger appropriately will improve Outcome: Progressing Patient verbalized frustration to staff.    

## 2016-01-07 NOTE — Tx Team (Signed)
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 acetaminophen (TYLENOL) tablet 1,000 mg  1,000 mg Oral Q6H PRN Jimmy Footman, MD   1,000 mg at 01/06/16 2151  . alum & mag hydroxide-simeth (MAALOX/MYLANTA) 200-200-20 MG/5ML suspension 30 mL  30 mL Oral Q4H PRN Audery Amel, MD      . hydrOXYzine (ATARAX/VISTARIL) tablet 25 mg  25 mg Oral TID PRN Jimmy Footman, MD   25 mg at 01/06/16 2151  . magnesium hydroxide (MILK OF MAGNESIA) suspension 30 mL  30 mL Oral Daily PRN Audery Amel, MD      . meloxicam (MOBIC) tablet 15 mg  15 mg Oral Daily PRN Jimmy Footman, MD   15 mg at 01/07/16 0836  . mirtazapine (REMERON) tablet 15 mg  15 mg Oral QHS Jimmy Footman, MD   15 mg at 01/06/16 2150  . sertraline (ZOLOFT) tablet 12.5 mg  12.5 mg Oral Daily Jimmy Footman, MD   12.5 mg at 01/07/16 4098   PTA Medications: Prescriptions Prior to Admission  Medication Sig Dispense Refill Last Dose  . albuterol (PROVENTIL HFA;VENTOLIN HFA) 108 (90 Base) MCG/ACT inhaler Inhale 1-2 puffs into the lungs every 6 (six) hours as needed for wheezing or shortness of breath. 1 Inhaler 0 prn at prn  . baclofen (LIORESAL) 20 MG tablet Take 20 mg by mouth 3 (three) times daily.   prn at prn  . clonazePAM (KLONOPIN) 1 MG tablet Take 1 mg by mouth 2 (two) times daily.   01/02/2016 at 0900  . DULoxetine (CYMBALTA) 30 MG capsule Take 30 mg by mouth daily.   unknown at unknown  . gabapentin  (NEURONTIN) 100 MG capsule Take 100 mg by mouth 2 (two) times daily.   unknown at unknown  . meloxicam (MOBIC) 15 MG tablet Take 1 tablet (15 mg total) by mouth daily. 30 tablet 0 Past Month at Unknown time    Patient Stressors: Financial difficulties Marital or family conflict Medication change or noncompliance  Patient Strengths: Ability for insight Average or above average intelligence Capable of independent living Communication skills Supportive family/friends  Treatment Modalities: Medication Management, Group therapy, Case management,  1 to 1 session with clinician, Psychoeducation, Recreational therapy.   Physician Treatment Plan for Primary Diagnosis: Severe recurrent major depression without psychotic features (HCC) Long Term Goal(s): Improvement in symptoms so as ready for discharge Improvement in symptoms so as ready for discharge   Short Term Goals: Ability to identify changes in lifestyle to reduce recurrence of condition will improve Ability to verbalize feelings will improve Ability to disclose and discuss suicidal ideas Ability to demonstrate self-control will improve Ability to identify and develop effective coping behaviors will improve Compliance with prescribed medications will improve Ability to identify triggers associated with substance abuse/mental health issues will improve Ability to demonstrate self-control will improve Ability to identify and develop effective coping behaviors will improve Ability to identify triggers associated with substance abuse/mental health issues will improve  Medication Management: Evaluate patient's response, side effects, and tolerance of medication regimen.  Therapeutic Interventions: 1 to 1 sessions, Unit Group sessions and Medication  administration.  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  Long Term Goal(s): Improvement in symptoms so as ready for discharge Improvement in symptoms so as ready for discharge   Short Term Goals: Ability to identify changes in lifestyle to reduce recurrence of condition will improve Ability to verbalize feelings will improve Ability to disclose and discuss suicidal ideas Ability to demonstrate self-control will improve Ability to identify and develop effective coping behaviors will improve Compliance with prescribed medications will improve Ability to identify triggers associated with substance abuse/mental health issues will improve Ability to demonstrate self-control will improve Ability to identify and develop effective coping behaviors will improve Ability to identify triggers associated with substance abuse/mental health issues will improve     Medication Management: Evaluate patient's response, side effects, and tolerance of medication regimen.  Therapeutic Interventions: 1 to 1 sessions, Unit Group sessions and Medication administration.  Evaluation of Outcomes: Adequate for Discharge   RN Treatment Plan for Primary Diagnosis: Severe recurrent major depression without psychotic features (HCC) Long Term Goal(s): Knowledge of disease and therapeutic regimen to maintain health will improve  Short Term Goals: Ability to remain free from injury will improve, Ability to verbalize feelings will improve, Ability to disclose and discuss suicidal ideas and Compliance with prescribed medications will improve  Medication Management: RN will administer medications as ordered by provider, will assess and evaluate patient's response and provide education to patient for prescribed medication. RN will report any adverse and/or side effects to prescribing provider.  Therapeutic Interventions: 1 on 1 counseling sessions, Psychoeducation, Medication  administration, Evaluate responses to treatment, Monitor vital signs and CBGs as ordered, Perform/monitor CIWA, COWS, AIMS and Fall Risk screenings as ordered, Perform wound care treatments as ordered.  Evaluation of Outcomes: Adequate for Discharge   LCSW Treatment Plan for Primary Diagnosis: Severe recurrent major depression without psychotic features (HCC) Long Term Goal(s): Safe transition to appropriate next level of care at discharge, Engage patient in therapeutic group addressing interpersonal concerns.  Short Term Goals: Engage patient in aftercare planning with referrals and resources, Increase social support, Increase ability to appropriately verbalize feelings, Facilitate acceptance of mental health diagnosis and concerns, Identify triggers associated with mental health/substance abuse issues and Increase skills for wellness and recovery  Therapeutic Interventions: Assess for all discharge needs, 1 to 1 time with Social worker, Explore available resources and support systems, Assess for adequacy in community support network, Educate family and significant other(s) on suicide prevention, Complete Psychosocial Assessment, Interpersonal group therapy.  Evaluation of Outcomes: Progressing   Progress in Treatment: Attending groups: No. Participating in groups: No. Taking medication as prescribed: Yes. Toleration medication: Yes. Family/Significant other contact made: Yes, individual(s) contacted:  grandmother Patient understands diagnosis: Yes. Discussing patient identified problems/goals with staff: Yes. Medical problems stabilized or resolved: Yes. Denies suicidal/homicidal ideation: Yes. Issues/concerns per patient self-inventory: No. Other: n/a  New problem(s) identified: None identified at this time  New Short Term/Long Term Goal(s): None identified at this time.  Discharge Plan or Barriers: Patient will discharge to home to living with her grandmother and has scheduled  follow-up appointment with [facility] on 01/11/2016 at 11:00am.  Reason for Continuation of Hospitalization: Anticipated D/C 01/07/2016.  Estimated Length of Stay: Anticipated D/C 01/07/2016  Attendees: Patient: Vicki Black 01/07/2016 8:57 AM  Physician: Dr. Sue Lush  Huntley DecHernandez- Gonzalez, MD 01/07/2016 8:57 AM  Nursing: Leonia ReaderPhyllis Cobb, RN 01/07/2016 8:57 AM  RN Care Manager: 01/07/2016 8:57 AM  Social Worker: Fredrich BirksAmaris G. Garnette CzechSampson MSW, LCSWA 01/07/2016 8:57 AM  Recreational Therapist:  01/07/2016 8:57 AM  Other:  01/07/2016 8:57 AM  Other:  01/07/2016 8:57 AM  Other: 01/07/2016 8:57 AM    Scribe for Treatment Team: Arelia LongestAmaris G Asia Favata, LCSWA 01/07/2016 9:11 AM

## 2016-01-07 NOTE — Progress Notes (Signed)
  [facility] :  Will you be returning to the same living situation after discharge:  No., Patient will be living with her grandmother.  At discharge, do you have transportation home?: Yes,  grandmother or mother Do you have the ability to pay for your medications: Yes,  patient has insurance  Release of information consent forms completed and in the chart;  Patient's signature needed at discharge.  Patient to Follow up at: Follow-up Information    [facility] Go on 01/11/2016.   Why:  Scheduled appointment with [facility] 01/11/2016 at 11:00am for medication management and therapy. Please bring ID, insurance information, recent discharge summary and medications/precriptions. Contact information: [address] [place], [place] [address] [phone] or [phone] Fax:[phone]           Next level of care provider has [facility] to [facility] Link:no  Safety Planning and Suicide Prevention discussed: Yes,  with grandmother and with patient  Have you used any form of tobacco in the last 30 days? (Cigarettes, Smokeless Tobacco, Cigars, and/or Pipes): No  Has patient been referred to the Quitline?: Patient refused referral  Patient has been referred for addiction treatment: Yes  Kyre Jeffries G. Garnette CzechSampson MSW, LCSWA 01/07/2016, 9:02 AM

## 2016-01-07 NOTE — Progress Notes (Signed)
D: Pt denies SI/HI/AVH, affect flat and sad , appears restless and irritable, c/o back pain and stated "she wanted something stronger than tylenol 1000 mg.  MD on call notified, her response was NO for patient to continue tylenol 1000 mg q 6 hours.Patient  is interacting with peers and staff appropriately.  A: Pt was offered support and encouragement. Pt was given scheduled medications. Pt was encouraged to attend groups. Q 15 minute checks were done for safety.  R:Pt attends groups and interacts well with peers and staff. Pt is taking medication. Pt has no complaints.Pt receptive to treatment and safety maintained on unit.

## 2016-01-07 NOTE — Progress Notes (Signed)
Patient discharged home. DC instructions provided and explained. Medications reviewed. Rx given. All questions answeed. Pt stable at discharge. Denies SI, HI, AVH. All belongings returned.

## 2016-01-10 ENCOUNTER — Encounter: Payer: Self-pay | Admitting: Emergency Medicine

## 2016-01-10 ENCOUNTER — Emergency Department
Admission: EM | Admit: 2016-01-10 | Discharge: 2016-01-10 | Disposition: A | Discharge status: Home / Self Care | Payer: Medicaid Other | Attending: Emergency Medicine | Admitting: Emergency Medicine

## 2016-01-10 ENCOUNTER — Emergency Department: Payer: Medicaid Other

## 2016-01-10 DIAGNOSIS — F419 Anxiety disorder, unspecified: Secondary | ICD-10-CM | POA: Diagnosis not present

## 2016-01-10 DIAGNOSIS — R079 Chest pain, unspecified: Secondary | ICD-10-CM

## 2016-01-10 HISTORY — DX: Anxiety disorder, unspecified: F41.9

## 2016-01-10 LAB — BASIC METABOLIC PANEL
Anion gap: 7 (ref 5–15)
BUN: 11 mg/dL (ref 6–20)
CHLORIDE: 107 mmol/L (ref 101–111)
CO2: 25 mmol/L (ref 22–32)
Calcium: 8.7 mg/dL — ABNORMAL LOW (ref 8.9–10.3)
Creatinine, Ser: 0.67 mg/dL (ref 0.44–1.00)
GFR calc Af Amer: >60 mL/min (ref >60)
GFR calc non Af Amer: >60 mL/min (ref >60)
GLUCOSE: 84 mg/dL (ref 65–99)
Potassium: 3.2 mmol/L — ABNORMAL LOW (ref 3.5–5.1)
SODIUM: 139 mmol/L (ref 135–145)

## 2016-01-10 LAB — CBC
HEMATOCRIT: 38.9 % (ref 35.0–47.0)
Hemoglobin: 13.4 g/dL (ref 12.0–16.0)
MCH: 31.7 pg (ref 26.0–34.0)
MCHC: 34.5 g/dL (ref 32.0–36.0)
MCV: 91.9 fL (ref 80.0–100.0)
Platelets: 266 10*3/uL (ref 150–440)
RBC: 4.23 MIL/uL (ref 3.80–5.20)
RDW: 13.1 % (ref 11.5–14.5)
WBC: 6.9 10*3/uL (ref 3.6–11.0)

## 2016-01-10 LAB — TROPONIN I: Troponin I: <0.03 ng/mL (ref <0.03)

## 2016-01-10 MED ORDER — CLONAZEPAM 0.5 MG PO TABS
ORAL_TABLET | ORAL | Status: AC
  Administered 2016-01-10: 2 mg via ORAL
  Filled 2016-01-10: qty 4

## 2016-01-10 MED ORDER — CLONAZEPAM 1 MG PO TABS: 1.0000 mg | ORAL_TABLET | Freq: Every evening | ORAL | 0 refills | Status: DC | PRN

## 2016-01-10 MED ORDER — CLONAZEPAM 2 MG PO TABS
2.0000 mg | ORAL_TABLET | Freq: Once | ORAL | Status: AC
  Administered 2016-01-10: 2 mg via ORAL

## 2016-01-10 NOTE — ED Triage Notes (Signed)
Pt presents to ED with reports of left side chest pain that radiates to her right shoulder for 2-3 days. Pt states she has recently been put on new medications for anxiety and depression so she does not know if these are related.

## 2016-01-10 NOTE — ED Provider Notes (Signed)
[facility] Emergency Department Provider Note        Time seen: ----------------------------------------- 9:16 PM on 01/10/2016 -----------------------------------------    I have reviewed the triage vital signs and the nursing notes.   HISTORY  Chief Complaint Chest Pain    HPI Vicki Black is a 25 y.o. female who presents to ER for left-sided chest pain that radiates to her right shoulder for 2-3 days. Patient states she's recently put on the medications for anxiety depression and does not know if these symptoms are related. She was seen here 5 days ago for anxiety and depression issues. Patient states she is currently taking Zoloft and Remeron but states this does not work as well for her as cerebral has in the past. She is to take Klonopin for anxiety, does not have any currently.   Past Medical History:  Diagnosis Date  . Anxiety   . Back pain   . Bursitis of hip    trochanteric bursitis of both hips    Patient Active Problem List   Diagnosis Date Noted  . Severe recurrent major depression without psychotic features (HCC) 01/05/2016  . Cannabis abuse 01/05/2016  . 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 TUBAL LIGATION      Allergies Sulfa antibiotics  Social History Social History  Substance Use Topics  . Smoking status: Never Smoker  . Smokeless tobacco: Never Used  . 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 Cardiovascular: Positive for chest pain Respiratory: Negative for shortness of breath. Gastrointestinal: Negative for abdominal pain, vomiting and diarrhea. Genitourinary: Negative for dysuria. Musculoskeletal: Negative for back pain. Skin:  Negative for rash. Neurological: Negative for headaches, focal weakness or numbness.  10-point ROS otherwise negative.  ____________________________________________   PHYSICAL EXAM:  VITAL SIGNS: ED Triage Vitals  Enc Vitals Group     BP 01/10/16 1902 112/75     Pulse Rate 01/10/16 1902 71     Resp 01/10/16 1902 18     Temp 01/10/16 1902 98.2 F (36.8 C)     Temp Source 01/10/16 1902 Oral     SpO2 01/10/16 1902 100 %     Weight 01/10/16 1902 121 lb (54.9 kg)     Height 01/10/16 1902 5\' 5"  (1.651 m)     Head Circumference --      Peak Flow --      Pain Score 01/10/16 1903 10     Pain Loc --      Pain Edu? --      Excl. in GC? --     Constitutional: Alert and oriented. Well appearing and in no distress. Eyes: Conjunctivae are normal. PERRL. Normal extraocular movements. ENT   Head: Normocephalic and atraumatic.   Nose: No congestion/rhinnorhea.   Mouth/Throat: Mucous membranes are moist.   Neck: No stridor. Cardiovascular: Normal rate, regular rhythm. No murmurs, rubs, or gallops. Respiratory: Normal respiratory effort without tachypnea nor retractions. Breath sounds are clear and equal bilaterally. No wheezes/rales/rhonchi. Gastrointestinal: Soft and nontender. Normal bowel sounds Musculoskeletal: Nontender with normal range of motion in all extremities. No lower extremity tenderness nor edema. Neurologic:  Normal speech and language. No gross focal neurologic deficits are appreciated.  Skin:  Skin is warm, dry and intact. No rash noted. Psychiatric: Mood and affect are normal. Speech and behavior are normal.  ____________________________________________  EKG: Interpreted by me. Sinus rhythm with a rate of 66 bpm, normal PR interval, normal QRS, normal QT interval. Normal axis.  ____________________________________________  ED COURSE:  Pertinent labs & imaging results that were available during my care of the patient were reviewed by me and considered in  my medical decision making (see chart for details). Clinical Course  Patient is in no distress, we will assess with basic labs, this is likely anxiety related  Procedures ____________________________________________   LABS (pertinent positives/negatives)  Labs Reviewed  BASIC METABOLIC PANEL - Abnormal; Notable for the following:       Result Value   Potassium 3.2 (*)    Calcium 8.7 (*)    All other components within normal limits  CBC  TROPONIN I    RADIOLOGY  Chest x-ray is normal  ____________________________________________  FINAL ASSESSMENT AND PLAN  Chest pain, anxiety  Plan: Patient with labs and imaging as dictated above. Symptoms are most likely consistent with panic attacks. I will prescribe a short course of Klonopin for her to take as needed for anxiety. [facility].   Emily FilbertWilliams, Jonathan E, MD   Note: This dictation was prepared with Dragon dictation. Any transcriptional errors that result from this process are unintentional    Emily FilbertJonathan E Williams, MD 01/10/16 2128

## 2016-03-02 ENCOUNTER — Encounter: Payer: Self-pay | Admitting: Emergency Medicine

## 2016-03-02 ENCOUNTER — Emergency Department
Admission: EM | Admit: 2016-03-02 | Discharge: 2016-03-02 | Disposition: A | Discharge status: Home / Self Care | Payer: Medicaid Other | Attending: Emergency Medicine | Admitting: Emergency Medicine

## 2016-03-02 DIAGNOSIS — G8929 Other chronic pain: Secondary | ICD-10-CM | POA: Diagnosis not present

## 2016-03-02 DIAGNOSIS — M545 Low back pain, unspecified: Secondary | ICD-10-CM

## 2016-03-02 MED ORDER — KETOROLAC TROMETHAMINE 10 MG PO TABS: 10.0000 mg | ORAL_TABLET | Freq: Three times a day (TID) | ORAL | 0 refills | Status: DC

## 2016-03-02 MED ORDER — KETOROLAC TROMETHAMINE 30 MG/ML IJ SOLN
30.0000 mg | Freq: Once | INTRAMUSCULAR | Status: AC
  Administered 2016-03-02: 30 mg via INTRAMUSCULAR

## 2016-03-02 MED ORDER — ORPHENADRINE CITRATE ER 100 MG PO TB12: 100.0000 mg | ORAL_TABLET | Freq: Two times a day (BID) | ORAL | 0 refills | Status: DC | PRN

## 2016-03-02 MED ORDER — ORPHENADRINE CITRATE 30 MG/ML IJ SOLN
60.0000 mg | INTRAMUSCULAR | Status: AC
  Administered 2016-03-02: 60 mg via INTRAMUSCULAR
  Filled 2016-03-02: qty 2

## 2016-03-02 MED ORDER — KETOROLAC TROMETHAMINE 30 MG/ML IJ SOLN
30.0000 mg | Freq: Once | INTRAMUSCULAR | Status: DC
  Filled 2016-03-02: qty 1

## 2016-03-02 NOTE — ED Notes (Addendum)
Pt reports that she has a hx of chronic back pain, began worsening yesterday. Denies injury. Has MD appt tomorrow, needs relief today.

## 2016-03-02 NOTE — Discharge Instructions (Signed)
Take the prescription meds as directed. Follow-up with your provider as planned. Hold your other anti-inflammatory meds (NSAIDs) while taking the Toradol (ketorolac). Hold your other muscle relaxants med while taking the Norflex (orphenadrine). Apply ice or moist heat to reduce pain.

## 2016-03-02 NOTE — ED Triage Notes (Signed)
Chronic back pain.  Will see her doctor tomorrow, but  Needs relief now

## 2016-03-02 NOTE — ED Provider Notes (Signed)
[facility] Emergency Department Provider Note ____________________________________________  Time seen: [id number]  I have reviewed the triage vital signs and the nursing notes.  HISTORY  Chief Complaint  Back Pain  HPI Vicki Black is a 25 y.o. female to the ED for evaluation of her chronic back pain. The patient reports that she is to see her primary care provider tomorrow, and is scheduled to see pain management in about 2 weeks. She is currently on Robaxin but denies any significant benefit to her symptoms. She is requesting a prescription to help bridge the time between now and her pain management of evaluation. She denies any recent injury, accident, trauma. She describes a "flare" to her low back pain. She denies any distal paresthesias, foot drop, or incontinence.  Past Medical History:  Diagnosis Date  . Anxiety   . Back pain   . Bursitis of hip    trochanteric bursitis of both hips    Patient Active Problem List   Diagnosis Date Noted  . Severe recurrent major depression without psychotic features (HCC) 01/05/2016  . Cannabis abuse 01/05/2016  . 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 TUBAL LIGATION      Prior to Admission medications   Medication Sig Start Date End Date Taking? Authorizing Provider  albuterol (PROVENTIL HFA;VENTOLIN HFA) 108 (90 Base) MCG/ACT inhaler Inhale 1-2 puffs into the lungs every 6 (six) hours as needed for wheezing or shortness of breath. 05/19/15   Jami L Hagler, PA-C  clonazePAM (KLONOPIN) 1 MG tablet Take 1 tablet (1 mg total) by mouth at bedtime as needed for anxiety. 01/10/16 01/09/17  Emily FilbertJonathan E Williams, MD  ketorolac (TORADOL) 10 MG tablet Take 1 tablet (10 mg total) by mouth every 8 (eight) hours. 03/02/16   Kalyn Hofstra V Bacon Jahlen Bollman, PA-C  meloxicam (MOBIC) 15 MG tablet Take 1 tablet (15 mg total) by mouth daily as needed (pain moderate). 01/06/16    Jimmy FootmanAndrea Hernandez-Gonzalez, MD  mirtazapine (REMERON) 15 MG tablet Take 1 tablet (15 mg total) by mouth at bedtime. 01/06/16   Jimmy FootmanAndrea Hernandez-Gonzalez, MD  orphenadrine (NORFLEX) 100 MG tablet Take 1 tablet (100 mg total) by mouth 2 (two) times daily as needed for muscle spasms. 03/02/16   Patsey Pitstick V Bacon Clavin Ruhlman, PA-C  sertraline (ZOLOFT) 25 MG tablet Take 1 tablet (25 mg total) by mouth daily. 01/07/16   Jimmy FootmanAndrea Hernandez-Gonzalez, MD    Allergies Sulfa antibiotics  No family history on file.  Social History Social History  Substance Use Topics  . Smoking status: Never Smoker  . Smokeless tobacco: Never Used  . 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 Cardiovascular: Negative for chest pain. Respiratory: Negative for shortness of breath. Gastrointestinal: Negative for abdominal pain, vomiting and diarrhea. Genitourinary: Negative for dysuria. Musculoskeletal: Positive for back pain. Skin: Negative for rash. Neurological: Negative for headaches, focal weakness or numbness. ____________________________________________  PHYSICAL EXAM:  VITAL SIGNS: ED Triage Vitals [03/02/16 1034]  Enc Vitals Group     BP      Pulse      Resp      Temp      Temp src      SpO2  Weight      Height      Head Circumference      Peak Flow      Pain Score 10     Pain Loc      Pain Edu?      Excl. in GC?    Constitutional: Alert and oriented. Well appearing and in no distress. Head: Normocephalic and atraumatic. Cardiovascular: Normal rate, regular rhythm. Normal distal pulses. Respiratory: Normal respiratory effort. No wheezes/rales/rhonchi. Gastrointestinal: Soft and nontender. No distention. Musculoskeletal: No spinal alignment without midline tenderness, spasm, deformity,  step-off. Patient with negative supervising leg raise. Normal hip flexion and extension range. Nontender with normal range of motion in all extremities.  Neurologic: Nerves II through XII grossly intact. Normal LE DTRs bilaterally. Normal gait without ataxia. Normal speech and language. No gross focal neurologic deficits are appreciated. Skin:  Skin is warm, dry and intact. No rash noted. ____________________________________________  PROCEDURES  Toradol 30 mg IM Norflex 60 mg IM ____________________________________________  INITIAL IMPRESSION / ASSESSMENT AND PLAN / ED COURSE  Patient with chronic midline low back pain without sciatica. She is discharged with instructions for Toradol and Norflex to dose in lieu of her Robaxin and over-the-counter NSAIDs. She will follow with her primary care provider tomorrow as scheduled. Any narcotic medications at this time deferred to her PCP.  Clinical Course    ____________________________________________  FINAL CLINICAL IMPRESSION(S) / ED DIAGNOSES  Final diagnoses:  Chronic midline low back pain without sciatica      Vicki HoardJenise V Bacon Jeslin Bazinet, PA-C 03/02/16 1906    Governor Rooksebecca Lord, MD 03/05/16 1109

## 2016-04-07 ENCOUNTER — Encounter: Payer: Self-pay | Admitting: *Deleted

## 2016-04-07 DIAGNOSIS — F1290 Cannabis use, unspecified, uncomplicated: Secondary | ICD-10-CM | POA: Insufficient documentation

## 2016-04-07 DIAGNOSIS — Z79899 Other long term (current) drug therapy: Secondary | ICD-10-CM | POA: Diagnosis not present

## 2016-04-07 DIAGNOSIS — Z5321 Procedure and treatment not carried out due to patient leaving prior to being seen by health care provider: Secondary | ICD-10-CM | POA: Insufficient documentation

## 2016-04-07 DIAGNOSIS — R1012 Left upper quadrant pain: Secondary | ICD-10-CM | POA: Insufficient documentation

## 2016-04-07 DIAGNOSIS — R110 Nausea: Secondary | ICD-10-CM | POA: Diagnosis not present

## 2016-04-07 LAB — POCT PREGNANCY, URINE: PREG TEST UR: NEGATIVE

## 2016-04-07 NOTE — ED Triage Notes (Addendum)
Pt c/o LUQ abdominal pain starting today. Pt states radiates to her back. Pt denies diarrhea and urinary sxs. Pt denies fever. Pt c/o nausea. Pt has taken no meds. Pt did not drive herself to ED today. Pt did drink ETOH last night.

## 2016-04-08 ENCOUNTER — Emergency Department
Admission: EM | Admit: 2016-04-08 | Discharge: 2016-04-08 | Disposition: A | Discharge status: Home / Self Care | Payer: Medicaid Other | Attending: Student in an Organized Health Care Education/Training Program | Admitting: Student in an Organized Health Care Education/Training Program

## 2016-04-08 LAB — URINALYSIS, COMPLETE (UACMP) WITH MICROSCOPIC
Bilirubin Urine: NEGATIVE
Glucose, UA: NEGATIVE mg/dL
Ketones, ur: NEGATIVE mg/dL
Leukocytes, UA: NEGATIVE
Nitrite: NEGATIVE
Protein, ur: NEGATIVE mg/dL
Specific Gravity, Urine: 1.02 (ref 1.005–1.030)
pH: 6 (ref 5.0–8.0)

## 2016-04-08 LAB — COMPREHENSIVE METABOLIC PANEL
ALBUMIN: 4.8 g/dL (ref 3.5–5.0)
ALK PHOS: 41 U/L (ref 38–126)
ALT: 16 U/L (ref 14–54)
AST: 19 U/L (ref 15–41)
Anion gap: 5 (ref 5–15)
BUN: 12 mg/dL (ref 6–20)
CALCIUM: 9 mg/dL (ref 8.9–10.3)
CO2: 29 mmol/L (ref 22–32)
CREATININE: 0.79 mg/dL (ref 0.44–1.00)
Chloride: 105 mmol/L (ref 101–111)
GFR calc Af Amer: >60 mL/min (ref >60)
GFR calc non Af Amer: >60 mL/min (ref >60)
GLUCOSE: 104 mg/dL — AB (ref 65–99)
Potassium: 3.5 mmol/L (ref 3.5–5.1)
Sodium: 139 mmol/L (ref 135–145)
Total Bilirubin: 0.4 mg/dL (ref 0.3–1.2)
Total Protein: 7.9 g/dL (ref 6.5–8.1)

## 2016-04-08 LAB — CBC
HCT: 41.9 % (ref 35.0–47.0)
Hemoglobin: 14.3 g/dL (ref 12.0–16.0)
MCH: 31.7 pg (ref 26.0–34.0)
MCHC: 34.2 g/dL (ref 32.0–36.0)
MCV: 92.8 fL (ref 80.0–100.0)
PLATELETS: 288 10*3/uL (ref 150–440)
RBC: 4.51 MIL/uL (ref 3.80–5.20)
RDW: 13.1 % (ref 11.5–14.5)
WBC: 8.5 10*3/uL (ref 3.6–11.0)

## 2016-04-08 LAB — LIPASE, BLOOD: Lipase: 29 U/L (ref 11–51)

## 2016-04-08 MED ORDER — ONDANSETRON 4 MG PO TBDP
ORAL_TABLET | ORAL | Status: AC
Start: 2016-04-08 — End: 2016-04-08
  Administered 2016-04-08: 4 mg via ORAL
  Filled 2016-04-08: qty 1

## 2016-04-08 MED ORDER — ONDANSETRON 4 MG PO TBDP
4.0000 mg | ORAL_TABLET | Freq: Once | ORAL | Status: AC | PRN
  Administered 2016-04-08: 4 mg via ORAL

## 2016-06-13 ENCOUNTER — Encounter: Payer: Self-pay | Admitting: Emergency Medicine

## 2016-06-13 ENCOUNTER — Emergency Department
Admission: EM | Admit: 2016-06-13 | Discharge: 2016-06-13 | Disposition: A | Discharge status: Home / Self Care | Payer: Medicaid Other | Attending: Emergency Medicine | Admitting: Emergency Medicine

## 2016-06-13 DIAGNOSIS — R519 Headache, unspecified: Secondary | ICD-10-CM

## 2016-06-13 DIAGNOSIS — R51 Headache: Secondary | ICD-10-CM | POA: Insufficient documentation

## 2016-06-13 MED ORDER — SODIUM CHLORIDE 0.9 % IV BOLUS (SEPSIS)
1000.0000 mL | Freq: Once | INTRAVENOUS | Status: AC
  Administered 2016-06-13: 1000 mL via INTRAVENOUS

## 2016-06-13 MED ORDER — PROCHLORPERAZINE EDISYLATE 5 MG/ML IJ SOLN
10.0000 mg | Freq: Once | INTRAMUSCULAR | Status: AC
  Administered 2016-06-13: 10 mg via INTRAVENOUS
  Filled 2016-06-13: qty 2

## 2016-06-13 MED ORDER — PROCHLORPERAZINE MALEATE 10 MG PO TABS: 10.0000 mg | ORAL_TABLET | Freq: Three times a day (TID) | ORAL | 0 refills | Status: DC | PRN

## 2016-06-13 NOTE — ED Provider Notes (Signed)
[facility] Emergency Department Provider Note   ____________________________________________   I have reviewed the triage vital signs and the nursing notes.   HISTORY  Chief Complaint Headache   History limited by: Not Limited   HPI Vicki Black is a 26 y.o. female who presents to the emergency department today because of concerns for headache. This is been going on for the past week. It is located in the frontal part. Has been somewhat intermittent. The patient has tried multiple medications without any significant relief. It has been accompanied by some nausea. Patient states that she has a history of getting headaches and will occasionally take her mom's migraine medication. She denies any head trauma. No fevers.   Past Medical History:  Diagnosis Date  . Anxiety   . Back pain   . Bursitis of hip    trochanteric bursitis of both hips    Patient Active Problem List   Diagnosis Date Noted  . Severe recurrent major depression without psychotic features (HCC) 01/05/2016  . Cannabis abuse 01/05/2016  . 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 TUBAL LIGATION      Prior to Admission medications   Medication Sig Start Date End Date Taking? Authorizing Provider  albuterol (PROVENTIL HFA;VENTOLIN HFA) 108 (90 Base) MCG/ACT inhaler Inhale 1-2 puffs into the lungs every 6 (six) hours as needed for wheezing or shortness of breath. 05/19/15   Jami L Hagler, PA-C  clonazePAM (KLONOPIN) 1 MG tablet Take 1 tablet (1 mg total) by mouth at bedtime as needed for anxiety. 01/10/16 01/09/17  Emily Filbert, MD  desvenlafaxine (PRISTIQ) 50 MG 24 hr tablet Take 50 mg by mouth daily.    Historical Provider, MD  ketorolac (TORADOL) 10 MG tablet Take 1 tablet (10 mg total) by mouth every 8 (eight) hours. 03/02/16   Jenise V Bacon Menshew, PA-C  meloxicam (MOBIC) 15 MG tablet Take 1 tablet (15 mg total)  by mouth daily as needed (pain moderate). 01/06/16   Jimmy Footman, MD  mirtazapine (REMERON) 15 MG tablet Take 1 tablet (15 mg total) by mouth at bedtime. 01/06/16   Jimmy Footman, MD  orphenadrine (NORFLEX) 100 MG tablet Take 1 tablet (100 mg total) by mouth 2 (two) times daily as needed for muscle spasms. 03/02/16   Jenise V Bacon Menshew, PA-C  sertraline (ZOLOFT) 25 MG tablet Take 1 tablet (25 mg total) by mouth daily. 01/07/16   Jimmy Footman, MD    Allergies Sulfa antibiotics  No family history on file.  Social History Social History  Substance Use Topics  . Smoking status: Never Smoker  . Smokeless tobacco: Never Used  . 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 Cardiovascular: Negative for chest pain. Respiratory: Negative for shortness of breath. Gastrointestinal: Negative for abdominal pain, vomiting and diarrhea. Genitourinary: Negative for dysuria. Musculoskeletal: Negative for back pain. Skin: Negative for rash. Neurological: Positive for headache.  10-point ROS otherwise negative.  ____________________________________________   PHYSICAL EXAM:  VITAL SIGNS: ED Triage Vitals  Enc Vitals Group     BP 06/13/16 1754 112/71     Pulse Rate 06/13/16 1754 100     Resp 06/13/16 1754 18  Temp 06/13/16 1754 98 F (36.7 C)     Temp Source 06/13/16 1754 Oral     SpO2 06/13/16 1754 100 %     Weight 06/13/16 1754 120 lb (54.4 kg)     Height 06/13/16 1754 5\' 5"  (1.651 m)     Head Circumference --      Peak Flow --      Pain Score 06/13/16 1728 7     Pain Loc --      Pain Edu? --      Excl. in GC? --      Constitutional: Alert and oriented. Well appearing and in no distress. Eyes: Conjunctivae are normal. Normal  extraocular movements. ENT   Head: Normocephalic and atraumatic.   Nose: No congestion/rhinnorhea.   Mouth/Throat: Mucous membranes are moist.   Neck: No stridor. Hematological/Lymphatic/Immunilogical: No cervical lymphadenopathy. Cardiovascular: Normal rate, regular rhythm.  No murmurs, rubs, or gallops.  Respiratory: Normal respiratory effort without tachypnea nor retractions. Breath sounds are clear and equal bilaterally. No wheezes/rales/rhonchi. Gastrointestinal: Soft and non tender. No rebound. No guarding.  Genitourinary: Deferred Musculoskeletal: Normal range of motion in all extremities. No lower extremity edema. Neurologic:  Normal speech and language. No gross focal neurologic deficits are appreciated.  Skin:  Skin is warm, dry and intact. No rash noted. Psychiatric: Mood and affect are normal. Speech and behavior are normal. Patient exhibits appropriate insight and judgment.  ____________________________________________    LABS (pertinent positives/negatives)  Labs Reviewed - No data to display   ____________________________________________   EKG  None  ____________________________________________    RADIOLOGY  None   ____________________________________________   PROCEDURES  Procedures  ____________________________________________   INITIAL IMPRESSION / ASSESSMENT AND PLAN / ED COURSE  Pertinent labs & imaging results that were available during my care of the patient were reviewed by me and considered in my medical decision making (see chart for details).  Patient presented to the emergency department today because of concerns for intermittent headache for one week. Patient was given IV fluids Compazine with good resolution of the patient's pain. Patient did feel comfortable being discharged home. Will give patient prescription for compazine.  ____________________________________________   FINAL CLINICAL IMPRESSION(S) / ED  DIAGNOSES  Final diagnoses:  Bad headache     Note: This dictation was prepared with Dragon dictation. Any transcriptional errors that result from this process are unintentional     Phineas SemenGraydon Jericka Kadar, MD 06/13/16 1910

## 2016-06-13 NOTE — ED Triage Notes (Signed)
Headache intermittently x 1 week.  Photosensitivity with headache.

## 2016-06-13 NOTE — Discharge Instructions (Signed)
Please seek medical attention for any high fevers, chest pain, shortness of breath, change in behavior, persistent vomiting, bloody stool or any other new or concerning symptoms.  

## 2016-07-06 ENCOUNTER — Emergency Department: Payer: Medicaid Other

## 2016-07-06 ENCOUNTER — Emergency Department
Admission: EM | Admit: 2016-07-06 | Discharge: 2016-07-06 | Disposition: A | Discharge status: Home / Self Care | Payer: Medicaid Other | Attending: Emergency Medicine | Admitting: Emergency Medicine

## 2016-07-06 ENCOUNTER — Encounter: Payer: Self-pay | Admitting: Emergency Medicine

## 2016-07-06 DIAGNOSIS — R110 Nausea: Secondary | ICD-10-CM | POA: Insufficient documentation

## 2016-07-06 DIAGNOSIS — R42 Dizziness and giddiness: Secondary | ICD-10-CM | POA: Diagnosis not present

## 2016-07-06 DIAGNOSIS — H538 Other visual disturbances: Secondary | ICD-10-CM | POA: Insufficient documentation

## 2016-07-06 DIAGNOSIS — R51 Headache: Secondary | ICD-10-CM | POA: Insufficient documentation

## 2016-07-06 DIAGNOSIS — R519 Headache, unspecified: Secondary | ICD-10-CM

## 2016-07-06 LAB — POCT PREGNANCY, URINE: PREG TEST UR: NEGATIVE

## 2016-07-06 MED ORDER — SODIUM CHLORIDE 0.9 % IV SOLN
Freq: Once | INTRAVENOUS | Status: AC
  Administered 2016-07-06: 999 mL via INTRAVENOUS

## 2016-07-06 MED ORDER — KETOROLAC TROMETHAMINE 30 MG/ML IJ SOLN: 30.0000 mg | Freq: Once | INTRAMUSCULAR | Status: DC

## 2016-07-06 MED ORDER — DIPHENHYDRAMINE HCL 50 MG/ML IJ SOLN
25.0000 mg | Freq: Once | INTRAMUSCULAR | Status: AC
  Administered 2016-07-06: 25 mg via INTRAVENOUS
  Filled 2016-07-06: qty 1

## 2016-07-06 MED ORDER — METOCLOPRAMIDE HCL 5 MG/ML IJ SOLN
10.0000 mg | Freq: Once | INTRAMUSCULAR | Status: AC
Start: 2016-07-06 — End: 2016-07-06
  Administered 2016-07-06: 10 mg via INTRAVENOUS
  Filled 2016-07-06: qty 2

## 2016-07-06 MED ORDER — DEXAMETHASONE SODIUM PHOSPHATE 10 MG/ML IJ SOLN
10.0000 mg | Freq: Once | INTRAMUSCULAR | Status: AC
Start: 2016-07-06 — End: 2016-07-06
  Administered 2016-07-06: 10 mg via INTRAVENOUS
  Filled 2016-07-06: qty 1

## 2016-07-06 MED ORDER — SODIUM CHLORIDE 0.9 % IV BOLUS (SEPSIS): 1000.0000 mL | Freq: Once | INTRAVENOUS | Status: DC
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
## 2016-07-06 NOTE — Discharge Instructions (Signed)
You will  need follow-up with your primary care doctor next week for any continued medication. Increase fluids. Avoid alcohol. Wear sunglasses for eye protection from the sun.
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
## 2016-07-06 NOTE — ED Notes (Signed)
Up to bathroom  Ambulates well  States she still having some pain to right side of head  But feels better  Pt is texting and lights on in room

## 2016-09-05 ENCOUNTER — Ambulatory Visit: Payer: Self-pay | Admitting: Pain Medicine

## 2016-10-20 ENCOUNTER — Emergency Department
Admission: EM | Admit: 2016-10-20 | Discharge: 2016-10-20 | Disposition: A | Discharge status: Home / Self Care | Payer: Medicaid Other | Attending: Emergency Medicine | Admitting: Emergency Medicine

## 2016-10-20 ENCOUNTER — Encounter: Payer: Self-pay | Admitting: *Deleted

## 2016-10-20 DIAGNOSIS — N949 Unspecified condition associated with female genital organs and menstrual cycle: Secondary | ICD-10-CM | POA: Diagnosis not present

## 2016-10-20 DIAGNOSIS — M549 Dorsalgia, unspecified: Secondary | ICD-10-CM | POA: Diagnosis present

## 2016-10-20 DIAGNOSIS — G8929 Other chronic pain: Secondary | ICD-10-CM

## 2016-10-20 DIAGNOSIS — Z79899 Other long term (current) drug therapy: Secondary | ICD-10-CM | POA: Insufficient documentation

## 2016-10-20 DIAGNOSIS — R6889 Other general symptoms and signs: Secondary | ICD-10-CM | POA: Diagnosis not present

## 2016-10-20 LAB — COMPREHENSIVE METABOLIC PANEL
ALBUMIN: 4.2 g/dL (ref 3.5–5.0)
ALK PHOS: 34 U/L — AB (ref 38–126)
ALT: 13 U/L — AB (ref 14–54)
ANION GAP: 5 (ref 5–15)
AST: 19 U/L (ref 15–41)
BILIRUBIN TOTAL: 0.7 mg/dL (ref 0.3–1.2)
BUN: 8 mg/dL (ref 6–20)
CALCIUM: 9 mg/dL (ref 8.9–10.3)
CO2: 25 mmol/L (ref 22–32)
Chloride: 109 mmol/L (ref 101–111)
Creatinine, Ser: 0.68 mg/dL (ref 0.44–1.00)
GFR calc Af Amer: >60 mL/min (ref >60)
GFR calc non Af Amer: >60 mL/min (ref >60)
GLUCOSE: 78 mg/dL (ref 65–99)
Potassium: 3.6 mmol/L (ref 3.5–5.1)
SODIUM: 139 mmol/L (ref 135–145)
Total Protein: 7 g/dL (ref 6.5–8.1)

## 2016-10-20 LAB — CBC
HCT: 42.1 % (ref 35.0–47.0)
Hemoglobin: 14.1 g/dL (ref 12.0–16.0)
MCH: 31.1 pg (ref 26.0–34.0)
MCHC: 33.5 g/dL (ref 32.0–36.0)
MCV: 92.8 fL (ref 80.0–100.0)
PLATELETS: 204 10*3/uL (ref 150–440)
RBC: 4.54 MIL/uL (ref 3.80–5.20)
RDW: 13.3 % (ref 11.5–14.5)
WBC: 6.4 10*3/uL (ref 3.6–11.0)

## 2016-10-20 LAB — URINALYSIS, COMPLETE (UACMP) WITH MICROSCOPIC
BILIRUBIN URINE: NEGATIVE
Bacteria, UA: NONE SEEN
Glucose, UA: NEGATIVE mg/dL
Ketones, ur: NEGATIVE mg/dL
Leukocytes, UA: NEGATIVE
Nitrite: NEGATIVE
PH: 5 (ref 5.0–8.0)
Protein, ur: NEGATIVE mg/dL
RBC / HPF: NONE SEEN RBC/hpf (ref 0–5)
SPECIFIC GRAVITY, URINE: 1.018 (ref 1.005–1.030)

## 2016-10-20 LAB — LIPASE, BLOOD: Lipase: 35 U/L (ref 11–51)

## 2016-10-20 LAB — TSH: TSH: 0.488 u[IU]/mL (ref 0.350–4.500)

## 2016-10-20 MED ORDER — METHYLPREDNISOLONE SODIUM SUCC 125 MG IJ SOLR
60.0000 mg | Freq: Once | INTRAMUSCULAR | Status: AC
  Administered 2016-10-20: 60 mg via INTRAVENOUS
  Filled 2016-10-20: qty 2

## 2016-10-20 MED ORDER — SODIUM CHLORIDE 0.9 % IV BOLUS (SEPSIS)
1000.0000 mL | Freq: Once | INTRAVENOUS | Status: AC
  Administered 2016-10-20: 1000 mL via INTRAVENOUS

## 2016-10-20 MED ORDER — KETOROLAC TROMETHAMINE 30 MG/ML IJ SOLN
30.0000 mg | Freq: Once | INTRAMUSCULAR | Status: AC
  Administered 2016-10-20: 30 mg via INTRAVENOUS
  Filled 2016-10-20: qty 1

## 2016-10-20 MED ORDER — VALACYCLOVIR HCL 500 MG PO TABS
1000.0000 mg | ORAL_TABLET | Freq: Once | ORAL | Status: AC
  Administered 2016-10-20: 1000 mg via ORAL
  Filled 2016-10-20: qty 2

## 2016-10-20 MED ORDER — METHYLPREDNISOLONE SODIUM SUCC 125 MG IJ SOLR: 60.0000 mg | Freq: Once | INTRAMUSCULAR | Status: DC

## 2016-10-20 MED ORDER — KETOROLAC TROMETHAMINE 60 MG/2ML IM SOLN: 30.0000 mg | Freq: Once | INTRAMUSCULAR | Status: DC

## 2016-10-20 NOTE — ED Provider Notes (Signed)
[facility] Emergency Department Provider Note  ____________________________________________  Time seen: Approximately 12:04 PM  I have reviewed the triage vital signs and the nursing notes.   HISTORY  Chief Complaint Back Pain; Sore Throat; and SEXUALLY TRANSMITTED DISEASE    HPI Vicki Black is a 26 y.o. female that presents to the emergency department for evaluation for several concerns. The main reason for her ED visit today is for mid back pain. Pain does not radiate.  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 She has not had any back pain today. She has received Toradol and prednisone for this in the past for symptoms. No bowel or bladder dysfunction. She would also like a vaginal lesion evaluated. She states that she was diagnosed with herpes in 2015 and is wondering if that is what this lesion is. Area has a burning sensation. Lesion hurts during urination but she is not having any burning with urination or difficulty urinating.  Her hands have also been swollen recently. She thinks that she has gets stressed, which makes her skin dry and herself "sick on the inside." When she gets angry, her heart races and she is wondering if her heartbeat also gets irregular because her mother has an irregular heartbeat. She states that when she gets mad she will hit people, but she will not help me because I'm trying to help her. No suicidal or homicidal ideations. She does not drink any water and thinks she is dehydrated. She is requesting fluids. When she does not eat, her stomach hurts. She might be congested but would like a medical opinion. She also says that she has marijuana, Valium, Xanax in her system. She would like a Valium but will settle for Toradol and prednisone "if you cannot do Valium." She is diagnosed with bipolar disorder 2 days ago and was started on Seroquel but has not yet begun the medication. She denies shortness  breath, chest pain, nausea, vomiting, dysuria.   Past Medical History:  Diagnosis Date  . Anxiety   . Back pain   . Bursitis of hip    trochanteric bursitis of both hips    Patient Active Problem List   Diagnosis Date Noted  . Severe recurrent major depression without psychotic features (HCC) 01/05/2016  . Cannabis abuse 01/05/2016  . 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 TUBAL LIGATION      Prior to Admission medications   Medication Sig Start Date End Date Taking? Authorizing Provider  desvenlafaxine (PRISTIQ) 50 MG 24 hr tablet Take 50 mg by mouth daily.    [provider]  mirtazapine (REMERON) 15 MG tablet Take 1 tablet (15 mg total) by mouth at bedtime. 01/06/16   Jimmy Footman, MD    Allergies Sulfa antibiotics and Vicodin [hydrocodone-acetaminophen]  History reviewed. No pertinent family history.  Social History Social History  Substance Use Topics  . Smoking status: Never Smoker  . Smokeless tobacco: Never Used  . Alcohol use Yes     Comment: socially     Review of Systems  Constitutional: No fever/chills Cardiovascular: No chest pain. Respiratory: No SOB. Gastrointestinal: No abdominal pain.  No nausea, no vomiting.  Musculoskeletal: Positive for back pain. Skin: Negative for rash, abrasions, lacerations, ecchymosis. Neurological: Negative for headaches, numbness or tingling   ____________________________________________   PHYSICAL EXAM:  VITAL SIGNS: ED Triage Vitals  Enc Vitals Group  BP 10/20/16 1114 (!) 113/56     Pulse Rate 10/20/16 1114 (!) 102     Resp 10/20/16 1114 16     Temp 10/20/16 1114 98.5 F (36.9 C)     Temp Source 10/20/16 1114 Oral     SpO2 10/20/16 1114 100 %     Weight 10/20/16 1114 118 lb (53.5 kg)     Height 10/20/16 1114 5\' 4"  (1.626 m)     Head Circumference --      Peak Flow --      Pain Score 10/20/16 1113 8     Pain  Loc --      Pain Edu? --      Excl. in GC? --      Constitutional: Alert and oriented.  Eyes: Conjunctivae are normal. PERRL. EOMI. Head: Atraumatic. ENT:      Ears:      Nose: No congestion/rhinnorhea.      Mouth/Throat: Mucous membranes are moist. Oropharynx nonerythematous. Neck: No stridor.   Cardiovascular: Normal rate, regular rhythm.  Good peripheral circulation. Respiratory: Normal respiratory effort without tachypnea or retractions. Lungs CTAB. Good air entry to the bases with no decreased or absent breath sounds. Gastrointestinal: Bowel sounds 4 quadrants. Soft and nontender to palpation. No guarding or rigidity. No palpable masses. No distention. Genitourinary: RN present for external genital exam. Papule to right labia minora. Blood in vaginal canal. Musculoskeletal: Full range of motion to all extremities. No gross deformities appreciated. Neurologic:   No gross focal neurologic deficits are appreciated.  Skin:  Skin is warm, dry and intact. No rash noted.   ____________________________________________   LABS (all labs ordered are listed, but only abnormal results are displayed)  Labs Reviewed  URINALYSIS, COMPLETE (UACMP) WITH MICROSCOPIC - Abnormal; Notable for the following:       Result Value   Color, Urine YELLOW (*)    APPearance CLEAR (*)    Hgb urine dipstick MODERATE (*)    Squamous Epithelial / LPF 0-5 (*)    All other components within normal limits  COMPREHENSIVE METABOLIC PANEL - Abnormal; Notable for the following:    ALT 13 (*)    Alkaline Phosphatase 34 (*)    All other components within normal limits  CBC  LIPASE, BLOOD  TSH   ____________________________________________  EKG   ____________________________________________  RADIOLOGY  No results found.  ____________________________________________    PROCEDURES  Procedure(s) performed:    Procedures    Medications  sodium chloride 0.9 % bolus 1,000 mL (0 mLs  Intravenous Stopped 10/20/16 1350)  valACYclovir (VALTREX) tablet 1,000 mg (1,000 mg Oral Given 10/20/16 1411)  ketorolac (TORADOL) 30 MG/ML injection 30 mg (30 mg Intravenous Given 10/20/16 1410)  methylPREDNISolone sodium succinate (SOLU-MEDROL) 125 mg/2 mL injection 60 mg (60 mg Intravenous Given 10/20/16 1408)     ____________________________________________   INITIAL IMPRESSION / ASSESSMENT AND PLAN / ED COURSE  Pertinent labs & imaging results that were available during my care of the patient were reviewed by me and considered in my medical decision making (see chart for details).  Review of the  [place] was performed in accordance of the [facility] prior to dispensing any controlled drugs.   Patient presented to emergency department with main concern for chronic back pain. Vital signs, blood work, exam are reassuring. Patient is requesting Toradol and prednisone for back, which has helped her pain previously. She is not having back pain currently. She states that she does not drink any water and would like  IV fluids. Patient does not appear dehydrated but fluid bolus was given. 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 Lesion does not look like a herpes outbreak but based on patient's sensation of burning, I will give her a Valtrex here and she has a prescription already for home. I recommended the patient not engaged in sexual intercourse this weekend. Patient has blood in her urine and is currently on her menstrual period. Patient states that when she gets angry her heart rate increases. She is concerned since her mother has an irregular heartbeat. No irregular heartbeat or tachycardia on EKG. Patient has an appointment with PCP on Monday. Patient is given ED precautions to return to the ED for any worsening or new symptoms.     ____________________________________________  FINAL CLINICAL IMPRESSION(S) / ED  DIAGNOSES  Final diagnoses:  Chronic back pain, unspecified back location, unspecified back pain laterality  Genital lesion, female      NEW MEDICATIONS STARTED DURING THIS VISIT:  Discharge Medication List as of 10/20/2016  2:03 PM          This chart was dictated using voice recognition software/Dragon. Despite best efforts to proofread, errors can occur which can change the meaning. Any change was purely unintentional.    Enid DerryWagner, Jsaon Yoo, PA-C 10/23/16 1411    Jene EveryKinner, Robert, MD 10/24/16 [phone]

## 2016-10-20 NOTE — ED Triage Notes (Signed)
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

## 2016-10-20 NOTE — ED Provider Notes (Signed)
ED ECG REPORT I, QUALE, MARK, the attending physician, personally viewed and interpreted this ECG.  Date: 10/20/2016 EKG Time: 1150 Rate: 85 Rhythm: normal sinus rhythm QRS Axis: normal Intervals: normal ST/T Wave abnormalities: normal Narrative Interpretation: unremarkable    Sharyn CreamerQuale, Mark, MD 10/20/16 1158

## 2016-10-20 NOTE — ED Notes (Signed)
ED Provider at bedside. 

## 2016-12-04 ENCOUNTER — Encounter: Payer: Self-pay | Admitting: Emergency Medicine

## 2016-12-04 ENCOUNTER — Emergency Department
Admission: EM | Admit: 2016-12-04 | Discharge: 2016-12-04 | Disposition: A | Discharge status: Home / Self Care | Payer: Medicaid Other | Attending: Emergency Medicine | Admitting: Emergency Medicine

## 2016-12-04 ENCOUNTER — Emergency Department: Payer: Medicaid Other

## 2016-12-04 DIAGNOSIS — R0981 Nasal congestion: Secondary | ICD-10-CM | POA: Diagnosis not present

## 2016-12-04 DIAGNOSIS — J180 Bronchopneumonia, unspecified organism: Secondary | ICD-10-CM | POA: Insufficient documentation

## 2016-12-04 DIAGNOSIS — R05 Cough: Secondary | ICD-10-CM | POA: Diagnosis present

## 2016-12-04 DIAGNOSIS — Z79899 Other long term (current) drug therapy: Secondary | ICD-10-CM | POA: Diagnosis not present

## 2016-12-04 DIAGNOSIS — Z87891 Personal history of nicotine dependence: Secondary | ICD-10-CM | POA: Diagnosis not present

## 2016-12-04 MED ORDER — AZITHROMYCIN 250 MG PO TABS: ORAL_TABLET | ORAL | 0 refills | Status: DC

## 2016-12-04 MED ORDER — BENZONATATE 100 MG PO CAPS: 200.0000 mg | ORAL_CAPSULE | Freq: Three times a day (TID) | ORAL | 0 refills | Status: DC | PRN

## 2016-12-04 NOTE — ED Triage Notes (Signed)
Pt to ed with c/o cough congestion and soreness in chest.  Pt states she does not know how long she has had cough and congestion.

## 2016-12-04 NOTE — ED Notes (Signed)
See triage note  Presents with cough and some congestion  States cough has been prod at times  Unsure of fever  But has had nausea  No vomiting  Also states she feels weak

## 2016-12-04 NOTE — Discharge Instructions (Signed)
Follow-up with your regular doctor if any continued problems. Begin taking Zithromax as directed and Tessalon Perles as needed for cough every 8 hours. Increase fluids. Tylenol or ibuprofen if needed for fever or body aches.

## 2016-12-04 NOTE — ED Provider Notes (Signed)
[facility] Emergency Department Provider Note  ____________________________________________   First MD Initiated Contact with Patient 12/04/16 0719     (approximate)  I have reviewed the triage vital signs and the nursing notes.   HISTORY  Chief Complaint Cough   HPI Vicki Black is a 26 y.o. female is her complaint of cough and congestion for approximately one week. Patient is very vague on how long she has had the symptoms. She denies any fever or chills. Patient states that she continues to have nasal congestion. She has been taking Advil without any relief. Patient admits to smoking marijuana but not cigarettes. She rates her pain as a 10 over 10.   Past Medical History:  Diagnosis Date  . Anxiety   . Back pain   . Bursitis of hip    trochanteric bursitis of both hips    Patient Active Problem List   Diagnosis Date Noted  . Severe recurrent major depression without psychotic features (HCC) 01/05/2016  . Cannabis abuse 01/05/2016  . 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 TUBAL LIGATION      Prior to Admission medications   Medication Sig Start Date End Date Taking? Authorizing Provider  azithromycin (ZITHROMAX Z-PAK) 250 MG tablet Take 2 tablets (500 mg) on  Day 1,  followed by 1 tablet (250 mg) once daily on Days 2 through 5. 12/04/16   Tommi RumpsSummers, Rhonda L, PA-C  benzonatate (TESSALON PERLES) 100 MG capsule Take 2 capsules (200 mg total) by mouth 3 (three) times daily as needed. 12/04/16 12/04/17  Tommi RumpsSummers, Rhonda L, PA-C  desvenlafaxine (PRISTIQ) 50 MG 24 hr tablet Take 50 mg by mouth daily.    [provider]  mirtazapine (REMERON) 15 MG tablet Take 1 tablet (15 mg total) by mouth at bedtime. 01/06/16   Jimmy FootmanHernandez-Gonzalez, Andrea, MD    Allergies Sulfa antibiotics and Vicodin [hydrocodone-acetaminophen]  History reviewed. No pertinent family history.  Social  History Social History  Substance Use Topics  . Smoking status: Former Games developermoker  . Smokeless tobacco: Never Used  . Alcohol use Yes     Comment: socially    Review of Systems Constitutional: No fever/chills ENT: Positive nasal congestion.  Cardiovascular: Denies chest pain. Respiratory: Denies shortness of breath. Positive cough. Gastrointestinal: No abdominal pain.  No nausea, no vomiting.   Musculoskeletal: Negative for back pain. Skin: Negative for rash. Neurological: Negative for headaches, focal weakness or numbness. Psychiatric:History of anxiety and depression. ___________________________________________   PHYSICAL EXAM:  VITAL SIGNS: ED Triage Vitals [12/04/16 0713]  Enc Vitals Group     BP 127/84     Pulse Rate 88     Resp 18     Temp 97.6 F (36.4 C)     Temp Source Oral     SpO2 98 %     Weight 118 lb (53.5 kg)     Height      Head Circumference      Peak Flow      Pain Score 10     Pain Loc      Pain Edu?      Excl. in GC?    Constitutional: Alert and oriented. Well appearing and in no acute distress. Eyes: Conjunctivae are normal.  Head: Atraumatic. Nose: Mild congestion/rhinnorhea. Mouth/Throat: Mucous membranes are moist.  Oropharynx non-erythematous.  EACs are clear, TMs are dull bilaterally. Neck: No stridor.   Hematological/Lymphatic/Immunilogical: No cervical lymphadenopathy. Cardiovascular:  Normal rate, regular rhythm. Grossly normal heart sounds.  Good peripheral circulation. Respiratory: Normal respiratory effort.  No retractions. Lungs CTAB. Congested cough without wheezing. Musculoskeletal: Moves upper and lower extremities without any difficulty. Normal gait was noted. Neurologic:  Normal speech and language. No gross focal neurologic deficits are appreciated.  Skin:  Skin is warm, dry and intact. No rash noted. Psychiatric: Mood and affect are normal. Speech and behavior are normal.  ____________________________________________     LABS (all labs ordered are listed, but only abnormal results are displayed)  Labs Reviewed - No data to display  RADIOLOGY  Dg Chest 2 View  Result Date: 12/04/2016 CLINICAL DATA:  26 year old current smoker presenting with recent onset of cough, chest congestion and chest soreness. EXAM: CHEST  2 VIEW COMPARISON:  01/10/2016, 05/19/2015 and earlier. FINDINGS: Cardiomediastinal silhouette unremarkable, unchanged. Slight asymmetric peribronchovascular opacity in the right lower lobe. Lungs otherwise clear. No pleural effusions. Hyperinflation is likely related to an excellent inspiratory effort. Visualized bony thorax intact. IMPRESSION: Right lower lobe bronchopneumonia is suspected. Electronically Signed   By: Hulan Saas M.D.   On: 12/04/2016 08:12    ____________________________________________   PROCEDURES  Procedure(s) performed: None  Procedures  Critical Care performed: No  ____________________________________________   INITIAL IMPRESSION / ASSESSMENT AND PLAN / ED COURSE  Pertinent labs & imaging results that were available during my care of the patient were reviewed by me and considered in my medical decision making (see chart for details).  Patient is follow-up with her primary care doctor if any continued problems. She was started on Zithromax and given a prescription for [employer] as needed for cough. She is to increase fluids. Take Tylenol or ibuprofen as needed for fever or body aches. She is return to the emergency room if any severe worsening of her symptoms.   ___________________________________________   FINAL CLINICAL IMPRESSION(S) / ED DIAGNOSES  Final diagnoses:  Acute bronchopneumonia      NEW MEDICATIONS STARTED DURING THIS VISIT:  Discharge Medication List as of 12/04/2016  8:20 AM    START taking these medications   Details  azithromycin (ZITHROMAX Z-PAK) 250 MG tablet Take 2 tablets (500 mg) on  Day 1,  followed by 1 tablet (250  mg) once daily on Days 2 through 5., Print    benzonatate (TESSALON PERLES) 100 MG capsule Take 2 capsules (200 mg total) by mouth 3 (three) times daily as needed., Starting Mon 12/04/2016, Until Tue 12/04/2017, Print         Note:  This document was prepared using Dragon voice recognition software and may include unintentional dictation errors.    Tommi Rumps, PA-C 12/04/16 1601    Governor Rooks, MD 12/09/16 [phone]

## 2016-12-05 ENCOUNTER — Emergency Department
Admission: EM | Admit: 2016-12-05 | Discharge: 2016-12-05 | Discharge status: Absent without leave | Payer: Medicaid Other

## 2016-12-06 ENCOUNTER — Emergency Department: Payer: Medicaid Other

## 2016-12-06 ENCOUNTER — Encounter: Payer: Self-pay | Admitting: Emergency Medicine

## 2016-12-06 ENCOUNTER — Telehealth: Payer: Self-pay | Admitting: Emergency Medicine

## 2016-12-06 ENCOUNTER — Emergency Department
Admission: EM | Admit: 2016-12-06 | Discharge: 2016-12-06 | Disposition: A | Discharge status: Home / Self Care | Payer: Medicaid Other | Attending: Emergency Medicine | Admitting: Emergency Medicine

## 2016-12-06 DIAGNOSIS — R51 Headache: Secondary | ICD-10-CM | POA: Diagnosis not present

## 2016-12-06 DIAGNOSIS — R0981 Nasal congestion: Secondary | ICD-10-CM | POA: Insufficient documentation

## 2016-12-06 DIAGNOSIS — Z5321 Procedure and treatment not carried out due to patient leaving prior to being seen by health care provider: Secondary | ICD-10-CM | POA: Insufficient documentation

## 2016-12-06 MED ORDER — KETOROLAC TROMETHAMINE 30 MG/ML IJ SOLN
30.0000 mg | Freq: Once | INTRAMUSCULAR | Status: AC
  Administered 2016-12-06: 30 mg via INTRAVENOUS

## 2016-12-06 MED ORDER — SODIUM CHLORIDE 0.9 % IV BOLUS (SEPSIS)
1000.0000 mL | Freq: Once | INTRAVENOUS | Status: AC
  Administered 2016-12-06: 1000 mL via INTRAVENOUS

## 2016-12-06 MED ORDER — KETOROLAC TROMETHAMINE 30 MG/ML IJ SOLN
INTRAMUSCULAR | Status: AC
  Administered 2016-12-06: 30 mg via INTRAVENOUS
  Filled 2016-12-06: qty 1

## 2016-12-06 MED ORDER — OXYCODONE-ACETAMINOPHEN 5-325 MG PO TABS
1.0000 | ORAL_TABLET | ORAL | Status: DC | PRN
Start: 2016-12-06 — End: 2016-12-06

## 2016-12-06 NOTE — ED Notes (Signed)
subwait empty, SL noted lying on trashcan, cannula intact; no answer when called from lobby

## 2016-12-06 NOTE — Telephone Encounter (Signed)
Called patient due to lwot to inquire about condition and follow up plans. No answer and mailbox is full.  Discussed CT result with dr Sharyn Creamerveronese--no change in treatment needed.

## 2016-12-06 NOTE — ED Notes (Signed)
Patient offered Percocet for pain, patient declined stating she would rather have IV fluids and Toradol, as she has not been hydrating as well d/t nasal congestion and discharge. Discussed with Dr. Manson PasseyBrown who gave verbal order for CT face and head d/t pain and horse hitting her face with head. If patient's CT results are WNL, patient to receive Toradol per Dr. Manson PasseyBrown.

## 2016-12-06 NOTE — ED Notes (Signed)
Patient transported to CT 

## 2016-12-06 NOTE — ED Triage Notes (Addendum)
Patient to ER for c/o sinus pressure/headache. Patient seen yesterday for same. Was then hit in head by horse after visit.   Patient yelling and cussing in triage about not receiving treatment she thought she should have at last visit, and because she was sent to lobby from EMS. Patient advised to calm down, that treatment was not being withheld from her.

## 2016-12-06 NOTE — ED Notes (Signed)
Patient upset d/t having to wait to see ER MD. Explained to patient that there are many people ahead of her, but that several orders have been obtained and performed already. Patient states she "just wants a steroid and something to calm me down". Advised patient that she will need to see MD before receiving these kinds of medications. Patient again advised of flow of ED, as patient was mad that she came by ambulance and was sent to the lobby/triage.

## 2017-02-23 ENCOUNTER — Emergency Department
Admission: EM | Admit: 2017-02-23 | Discharge: 2017-02-23 | Disposition: A | Discharge status: Home / Self Care | Payer: Medicaid Other | Attending: Emergency Medicine | Admitting: Emergency Medicine

## 2017-02-23 ENCOUNTER — Emergency Department: Payer: Medicaid Other

## 2017-02-23 ENCOUNTER — Encounter: Payer: Self-pay | Admitting: Emergency Medicine

## 2017-02-23 DIAGNOSIS — Z87891 Personal history of nicotine dependence: Secondary | ICD-10-CM | POA: Diagnosis not present

## 2017-02-23 DIAGNOSIS — R079 Chest pain, unspecified: Secondary | ICD-10-CM | POA: Diagnosis present

## 2017-02-23 DIAGNOSIS — Z79899 Other long term (current) drug therapy: Secondary | ICD-10-CM | POA: Insufficient documentation

## 2017-02-23 DIAGNOSIS — R0789 Other chest pain: Secondary | ICD-10-CM

## 2017-02-23 LAB — CBC
HCT: 42.1 % (ref 35.0–47.0)
Hemoglobin: 14.3 g/dL (ref 12.0–16.0)
MCH: 31.6 pg (ref 26.0–34.0)
MCHC: 34 g/dL (ref 32.0–36.0)
MCV: 92.9 fL (ref 80.0–100.0)
PLATELETS: 344 10*3/uL (ref 150–440)
RBC: 4.54 MIL/uL (ref 3.80–5.20)
RDW: 13.5 % (ref 11.5–14.5)
WBC: 7.4 10*3/uL (ref 3.6–11.0)

## 2017-02-23 LAB — BASIC METABOLIC PANEL
Anion gap: 8 (ref 5–15)
BUN: 11 mg/dL (ref 6–20)
CALCIUM: 9.2 mg/dL (ref 8.9–10.3)
CO2: 26 mmol/L (ref 22–32)
CREATININE: 0.86 mg/dL (ref 0.44–1.00)
Chloride: 103 mmol/L (ref 101–111)
GFR calc non Af Amer: >60 mL/min (ref >60)
GLUCOSE: 128 mg/dL — AB (ref 65–99)
Potassium: 3.1 mmol/L — ABNORMAL LOW (ref 3.5–5.1)
Sodium: 137 mmol/L (ref 135–145)

## 2017-02-23 LAB — FIBRIN DERIVATIVES D-DIMER (ARMC ONLY): Fibrin derivatives D-dimer (ARMC): 118.06 ng/mL (FEU) (ref 0.00–499.00)

## 2017-02-23 LAB — TROPONIN I: Troponin I: <0.03 ng/mL (ref <0.03)

## 2017-02-23 MED ORDER — KETOROLAC TROMETHAMINE 30 MG/ML IJ SOLN
30.0000 mg | Freq: Once | INTRAMUSCULAR | Status: AC
  Administered 2017-02-23: 30 mg via INTRAMUSCULAR
  Filled 2017-02-23: qty 1

## 2017-02-23 MED ORDER — NAPROXEN 500 MG PO TABS: 500.0000 mg | ORAL_TABLET | Freq: Two times a day (BID) | ORAL | 2 refills | Status: DC

## 2017-02-23 NOTE — ED Provider Notes (Signed)
[facility] Emergency Department Provider Note   ____________________________________________    I have reviewed the triage vital signs and the nursing notes.   HISTORY  Chief Complaint Nasal Congestion and Chest Pain     HPI Vicki Black is a 26 y.o. female who presents with primary complaint of left lower chest pain which is primarily in the chest wall.  This is been ongoing for approximately 1 week.  She denies trauma to the area.  No fevers or chills.  Mild nasal congestion, no significant cough.  No recent travel.  No calf pain.  No history of blood clots in the family.  No rash.  She has not taken anything for this.  Past Medical History:  Diagnosis Date  . Anxiety   . Back pain   . Bursitis of hip    trochanteric bursitis of both hips    Patient Active Problem List   Diagnosis Date Noted  . Severe recurrent major depression without psychotic features (HCC) 01/05/2016  . Cannabis abuse 01/05/2016  . 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 DENTAL SURGERY    . TUBAL LIGATION      Prior to Admission medications   Medication Sig Start Date End Date Taking? Authorizing Provider  azithromycin (ZITHROMAX Z-PAK) 250 MG tablet Take 2 tablets (500 mg) on  Day 1,  followed by 1 tablet (250 mg) once daily on Days 2 through 5. 12/04/16   Tommi RumpsSummers, Rhonda L, PA-C  benzonatate (TESSALON PERLES) 100 MG capsule Take 2 capsules (200 mg total) by mouth 3 (three) times daily as needed. 12/04/16 12/04/17  Tommi RumpsSummers, Rhonda L, PA-C  desvenlafaxine (PRISTIQ) 50 MG 24 hr tablet Take 50 mg by mouth daily.    [provider]  mirtazapine (REMERON) 15 MG tablet Take 1 tablet (15 mg total) by mouth at bedtime. 01/06/16   Jimmy FootmanHernandez-Gonzalez, Andrea, MD  naproxen (NAPROSYN) 500 MG tablet Take 1 tablet (500 mg total) by mouth 2 (two) times daily with a meal. 02/23/17   Jene EveryKinner, Dorise Gangi, MD      Allergies Sulfa antibiotics and Vicodin [hydrocodone-acetaminophen]  No family history on file.  Social History Social History   Tobacco Use  . Smoking status: Former Games developermoker  . Smokeless tobacco: Never Used  Substance Use Topics  . Alcohol use: Yes    Comment: socially  . Drug use: Yes    Types: Marijuana    Review of Systems  Constitutional: No fever/chills Eyes: No visual changes.  ENT: No sore throat. Cardiovascular: As above Respiratory: Denies shortness of breath. Gastrointestinal: No abdominal pain.  No nausea, no vomiting.   Genitourinary: Negative for dysuria. Musculoskeletal: Negative for back pain. Skin: Negative for rash. Neurological: Negative for headaches    ____________________________________________   PHYSICAL EXAM:  VITAL SIGNS: ED Triage Vitals  Enc Vitals Group     BP 02/23/17 1838 115/76     Pulse Rate 02/23/17 1838 92     Resp 02/23/17 1838 20     Temp 02/23/17 1838 98.5 F (36.9 C)     Temp Source 02/23/17 1838 Oral     SpO2 02/23/17 1838 100 %     Weight 02/23/17 1833 54.4 kg (120 lb)     Height 02/23/17 1833 1.651 m (5\' 5" )     Head Circumference --      Peak Flow --      Pain Score 02/23/17 1832 7  Pain Loc --      Pain Edu? --      Excl. in GC? --     Constitutional: Alert and oriented. No acute distress. Pleasant and interactive Eyes: Conjunctivae are normal.   Nose: No congestion/rhinnorhea. Mouth/Throat: Mucous membranes are moist.    Cardiovascular: Normal rate, regular rhythm.  Normal heart sounds . good peripheral circulation.  Mild chest wall tenderness palpation left lower anterior chest just beneath the breast. Respiratory: Normal respiratory effort.  No retractions. Lungs CTAB. Gastrointestinal: Soft and nontender. No distention.    Musculoskeletal: No lower extremity tenderness nor edema.  Warm and well perfused Neurologic:  Normal speech and language. No gross focal neurologic deficits are appreciated.   Skin:  Skin is warm, dry and intact. No rash noted. Psychiatric: Mood and affect are normal. Speech and behavior are normal.  ____________________________________________   LABS (all labs ordered are listed, but only abnormal results are displayed)  Labs Reviewed  BASIC METABOLIC PANEL - Abnormal; Notable for the following components:      Result Value   Potassium 3.1 (*)    Glucose, Bld 128 (*)    All other components within normal limits  CBC  TROPONIN I  FIBRIN DERIVATIVES D-DIMER ([facility] ONLY)   ____________________________________________  EKG  ED ECG REPORT I, Jene EveryKINNER, Ike Maragh, the attending physician, personally viewed and interpreted this ECG.  Date: 02/23/2017  Rhythm: normal sinus rhythm QRS Axis: normal Intervals: normal ST/T Wave abnormalities: normal S1 noted  ____________________________________________  RADIOLOGY  Chest x-ray normal ____________________________________________   PROCEDURES  Procedure(s) performed: No  Procedures   Critical Care performed: No ____________________________________________   INITIAL IMPRESSION / ASSESSMENT AND PLAN / ED COURSE  Pertinent labs & imaging results that were available during my care of the patient were reviewed by me and considered in my medical decision making (see chart for details).  Differential diagnosis: Chest wall pain, costochondritis, muscle injury, pericarditis/myocarditis  Medical record is noncontributory  Patient well-appearing in no acute distress.  Vital signs normal.  Chest x-ray is reassuring, exam is normal.  Lab work is benign.  D-dimer sent and normal.  Patient treated with Toradol, will prescribe NSAIDs with PCP follow-up for chest wall pain    ____________________________________________   FINAL CLINICAL IMPRESSION(S) / ED DIAGNOSES  Final diagnoses:  Chest wall pain        Note:  This document was prepared using Dragon voice recognition software and may include  unintentional dictation errors.    Jene EveryKinner, Dayvian Blixt, MD 02/23/17 2121

## 2017-02-23 NOTE — ED Notes (Signed)

## 2017-02-23 NOTE — ED Notes (Signed)
Patient transported to X-ray 

## 2017-02-23 NOTE — ED Notes (Signed)
Patient c/o left chest pain that radiates back and abdomen. Patient reports pain increases with deep inspiration. Patient reports earlier episode of nausea that has since resolved.

## 2017-02-23 NOTE — ED Triage Notes (Signed)
Patient presents to the ED with chest pain with deep breathing and laughing x 2 weeks.  Patient reports upper respiratory congestion, denies cough.  Patient states pain is mostly on the left lower side of her chest.  Denies tenderness to area.

## 2017-04-23 ENCOUNTER — Other Ambulatory Visit: Payer: Self-pay

## 2017-04-23 ENCOUNTER — Emergency Department
Admission: EM | Admit: 2017-04-23 | Discharge: 2017-04-24 | Disposition: A | Discharge status: Other Acute Inpatient Hospital | Payer: Medicaid Other | Attending: Emergency Medicine | Admitting: Emergency Medicine

## 2017-04-23 ENCOUNTER — Encounter: Payer: Self-pay | Admitting: *Deleted

## 2017-04-23 DIAGNOSIS — Z79899 Other long term (current) drug therapy: Secondary | ICD-10-CM | POA: Insufficient documentation

## 2017-04-23 DIAGNOSIS — F332 Major depressive disorder, recurrent severe without psychotic features: Secondary | ICD-10-CM | POA: Diagnosis not present

## 2017-04-23 DIAGNOSIS — Z87891 Personal history of nicotine dependence: Secondary | ICD-10-CM | POA: Insufficient documentation

## 2017-04-23 DIAGNOSIS — F69 Unspecified disorder of adult personality and behavior: Secondary | ICD-10-CM

## 2017-04-23 DIAGNOSIS — F23 Brief psychotic disorder: Secondary | ICD-10-CM | POA: Diagnosis not present

## 2017-04-23 DIAGNOSIS — F29 Unspecified psychosis not due to a substance or known physiological condition: Secondary | ICD-10-CM | POA: Diagnosis not present

## 2017-04-23 DIAGNOSIS — F1510 Other stimulant abuse, uncomplicated: Secondary | ICD-10-CM

## 2017-04-23 DIAGNOSIS — R4182 Altered mental status, unspecified: Secondary | ICD-10-CM | POA: Diagnosis present

## 2017-04-23 LAB — COMPREHENSIVE METABOLIC PANEL
ALT: 16 U/L (ref 14–54)
AST: 19 U/L (ref 15–41)
Albumin: 4.1 g/dL (ref 3.5–5.0)
Alkaline Phosphatase: 50 U/L (ref 38–126)
Anion gap: 14 (ref 5–15)
BILIRUBIN TOTAL: 0.7 mg/dL (ref 0.3–1.2)
BUN: 14 mg/dL (ref 6–20)
CHLORIDE: 103 mmol/L (ref 101–111)
CO2: 20 mmol/L — ABNORMAL LOW (ref 22–32)
CREATININE: 0.64 mg/dL (ref 0.44–1.00)
Calcium: 9.2 mg/dL (ref 8.9–10.3)
GFR calc non Af Amer: >60 mL/min (ref >60)
Glucose, Bld: 82 mg/dL (ref 65–99)
POTASSIUM: 3.9 mmol/L (ref 3.5–5.1)
Sodium: 137 mmol/L (ref 135–145)
TOTAL PROTEIN: 8.2 g/dL — AB (ref 6.5–8.1)

## 2017-04-23 LAB — CBC
HCT: 40.5 % (ref 35.0–47.0)
Hemoglobin: 13.7 g/dL (ref 12.0–16.0)
MCH: 30.9 pg (ref 26.0–34.0)
MCHC: 33.7 g/dL (ref 32.0–36.0)
MCV: 91.5 fL (ref 80.0–100.0)
PLATELETS: 292 10*3/uL (ref 150–440)
RBC: 4.43 MIL/uL (ref 3.80–5.20)
RDW: 12.6 % (ref 11.5–14.5)
WBC: 11.4 10*3/uL — AB (ref 3.6–11.0)

## 2017-04-23 LAB — SALICYLATE LEVEL

## 2017-04-23 LAB — ETHANOL

## 2017-04-23 LAB — ACETAMINOPHEN LEVEL: Acetaminophen (Tylenol), Serum: <10 ug/mL — ABNORMAL LOW (ref 10–30)

## 2017-04-23 NOTE — ED Notes (Signed)
[address] Kaylorhornberg, pts mother, [phone] Anell BarrGrandmother, Lynn Blythe, [phone].  Call first if calling tonight.  Tomorrow can call mom.

## 2017-04-23 NOTE — ED Notes (Signed)
Family in lobby. Ed Lucent Technologiesech Lisa called to notify this RN and family wanted to come back. I asked if family had any information about why pt was here. Tech Misty StanleyLisa states she doesn't think so.

## 2017-04-23 NOTE — ED Notes (Signed)
Pt to the ER voluntarily by family but is currently non verbal and has been all day. Attempted to speak with pt. Pt is calm but tearful and makes no eye contact. Pt refused to dress out in the front. This RN takes pt to the bathroom to change without difficulty. Pt does verbalize in a very low quiet voice that "it's too much at once". That is all she has said. Pt placed back in bed and given hot blankets to help her sleep. Advised pt that we can talk when she is ready.

## 2017-04-23 NOTE — ED Provider Notes (Signed)
[facility] Emergency Department Provider Note ____________________________________________   First MD Initiated Contact with Patient 04/23/17 2214     (approximate)  I have reviewed the triage vital signs and the nursing notes.   HISTORY  Chief Complaint Behavior Problem  History of present illness severely limited due to patient's behavior  HPI Vicki Black is a 27 y.o. female with past medical history as noted below who presents with a behavior problem.  Per the family, patient has been having highly increased stress and has been tearful and not speaking all day.  In the ED, patient is nonverbal.  She does occasionally nod when asked her certain questions and is responding appropriately.  She appears alert, but does not give any history.  Past Medical History:  Diagnosis Date  . Anxiety   . Back pain   . Bursitis of hip    trochanteric bursitis of both hips    Patient Active Problem List   Diagnosis Date Noted  . Severe recurrent major depression without psychotic features (HCC) 01/05/2016  . Cannabis abuse 01/05/2016  . 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 DENTAL SURGERY    . TUBAL LIGATION      Prior to Admission medications   Medication Sig Start Date End Date Taking? Authorizing Provider  azithromycin (ZITHROMAX Z-PAK) 250 MG tablet Take 2 tablets (500 mg) on  Day 1,  followed by 1 tablet (250 mg) once daily on Days 2 through 5. 12/04/16   Tommi Rumps, PA-C  benzonatate (TESSALON PERLES) 100 MG capsule Take 2 capsules (200 mg total) by mouth 3 (three) times daily as needed. 12/04/16 12/04/17  Tommi Rumps, PA-C  desvenlafaxine (PRISTIQ) 50 MG 24 hr tablet Take 50 mg by mouth daily.    [provider]  mirtazapine (REMERON) 15 MG tablet Take 1 tablet (15 mg total) by mouth at bedtime. 01/06/16   Jimmy Footman, MD  naproxen (NAPROSYN) 500 MG  tablet Take 1 tablet (500 mg total) by mouth 2 (two) times daily with a meal. 02/23/17   Jene Every, MD    Allergies Sulfa antibiotics and Vicodin [hydrocodone-acetaminophen]  No family history on file.  Social History Social History   Tobacco Use  . Smoking status: Former Games developer  . Smokeless tobacco: Never Used  Substance Use Topics  . Alcohol use: Yes    Comment: socially  . Drug use: Yes    Types: Marijuana    Review of Systems Level V caveat: Unable to obtain review of systems due to patient's behavior    ____________________________________________   PHYSICAL EXAM:  VITAL SIGNS: ED Triage Vitals  Enc Vitals Group     BP 04/23/17 1837 129/76     Pulse Rate 04/23/17 1837 (!) 125     Resp 04/23/17 1837 (!) 22     Temp 04/23/17 1837 98 F (36.7 C)     Temp src --      SpO2 04/23/17 1837 100 %     Weight 04/23/17 1851 110 lb (49.9 kg)     Height 04/23/17 1851 5\' 3"  (1.6 m)     Head Circumference --      Peak Flow --      Pain Score --      Pain Loc --      Pain Edu? --      Excl. in GC? --     Constitutional: Alert. Comfortable  appearing and in no acute distress. Eyes: Conjunctivae are normal.  Head: Atraumatic. Nose: No congestion/rhinnorhea. Mouth/Throat: Mucous membranes are moist.   Neck: Normal range of motion.  Cardiovascular:   Good peripheral circulation. Respiratory: Normal respiratory effort.   Gastrointestinal:  No distention.  Musculoskeletal:  Extremities warm and well perfused.  Neurologic: Motor intact in all extremities. Skin:  Skin is warm and dry. No rash noted. Psychiatric: Nonverbal.  ____________________________________________   LABS (all labs ordered are listed, but only abnormal results are displayed)  Labs Reviewed  COMPREHENSIVE METABOLIC PANEL - Abnormal; Notable for the following components:      Result Value   CO2 20 (*)    Total Protein 8.2 (*)    All other components within normal limits  ACETAMINOPHEN  LEVEL - Abnormal; Notable for the following components:   Acetaminophen (Tylenol), Serum <10 (*)    All other components within normal limits  CBC - Abnormal; Notable for the following components:   WBC 11.4 (*)    All other components within normal limits  ETHANOL  SALICYLATE LEVEL  URINE DRUG SCREEN, QUALITATIVE ([facility] ONLY)  POC URINE PREG, ED   ____________________________________________  EKG   ____________________________________________  RADIOLOGY    ____________________________________________   PROCEDURES  Procedure(s) performed: No    Critical Care performed: No ____________________________________________   INITIAL IMPRESSION / ASSESSMENT AND PLAN / ED COURSE  Pertinent labs & imaging results that were available during my care of the patient were reviewed by me and considered in my medical decision making (see chart for details).  27 year old female with past medical history as noted above presents with increased stress, and not speaking all day today.  The patient is unwilling/unable to provide any history when I attempted to speak with her.  However she does occasionally nod to certain questions and is alert and responsive.  Neuro exam is nonfocal.  There is no evidence of acute medical issue.  We will obtain labs for medical clearance.  At this time given that the patient is not willing to speak, I do not believe that [facility] would be productive.  We will observe the patient in the emergency department and likely proceed with in person psychiatric consultation in the morning.    I am signing the patient out to the oncoming physician Dr. York CeriseForbach.  ____________________________________________   FINAL CLINICAL IMPRESSION(S) / ED DIAGNOSES  Final diagnoses:  Behavior concern in adult      NEW MEDICATIONS STARTED DURING THIS VISIT:  New Prescriptions   No medications on file     Note:  This document was prepared using Dragon voice  recognition software and may include unintentional dictation errors.    Dionne BucySiadecki, Liel Rudden, MD 04/23/17 [phone]

## 2017-04-23 NOTE — ED Notes (Signed)
Pt taken to the bathroom and dressed out. 2 necklaces and rubber bands placed in bag with jeans, tennis shoes, tank top, sweatshirt, bra top and bra. Unable to remove piercings in ears. # total.

## 2017-04-23 NOTE — ED Notes (Addendum)
Pt to triage via wheelchair.  Pt tearful and nonverbal.  Pt not answering any questions this nurse ask her. Family states pt has been like this since this morning.  Pt awake and alert.  Family reports pt is under a lot of stress.

## 2017-04-23 NOTE — ED Triage Notes (Signed)
Pt to triage via wheelchair.  Pt not answering questions. Family states pt is under a lot of stress.  Tearful at times.

## 2017-04-23 NOTE — ED Notes (Signed)
Pt unable to void at this time. Pt still nonverbal.  Report off to lisa t rn first nurse.

## 2017-04-23 NOTE — ED Notes (Signed)
Patient refused to speak with TTS.  She layed on the bed curled up, covered in blankets and did not make eye contact. She did not acknowledge the TTS staff. TTS was unable to complete the assessment at this time.

## 2017-04-24 ENCOUNTER — Inpatient Hospital Stay
Admission: AD | Admit: 2017-04-24 | Discharge: 2017-04-30 | DRG: 885 | Disposition: A | Discharge status: Home / Self Care | Payer: Medicaid Other | Attending: Psychiatry | Admitting: Psychiatry

## 2017-04-24 ENCOUNTER — Other Ambulatory Visit: Payer: Self-pay

## 2017-04-24 DIAGNOSIS — Z882 Allergy status to sulfonamides status: Secondary | ICD-10-CM | POA: Diagnosis not present

## 2017-04-24 DIAGNOSIS — Z87891 Personal history of nicotine dependence: Secondary | ICD-10-CM | POA: Diagnosis not present

## 2017-04-24 DIAGNOSIS — F23 Brief psychotic disorder: Secondary | ICD-10-CM

## 2017-04-24 DIAGNOSIS — F419 Anxiety disorder, unspecified: Secondary | ICD-10-CM | POA: Diagnosis present

## 2017-04-24 DIAGNOSIS — F315 Bipolar disorder, current episode depressed, severe, with psychotic features: Secondary | ICD-10-CM | POA: Diagnosis not present

## 2017-04-24 DIAGNOSIS — R45851 Suicidal ideations: Secondary | ICD-10-CM | POA: Diagnosis present

## 2017-04-24 DIAGNOSIS — Z5181 Encounter for therapeutic drug level monitoring: Secondary | ICD-10-CM

## 2017-04-24 DIAGNOSIS — F1510 Other stimulant abuse, uncomplicated: Secondary | ICD-10-CM | POA: Diagnosis present

## 2017-04-24 DIAGNOSIS — F29 Unspecified psychosis not due to a substance or known physiological condition: Secondary | ICD-10-CM | POA: Diagnosis not present

## 2017-04-24 DIAGNOSIS — Z818 Family history of other mental and behavioral disorders: Secondary | ICD-10-CM

## 2017-04-24 DIAGNOSIS — F3130 Bipolar disorder, current episode depressed, mild or moderate severity, unspecified: Secondary | ICD-10-CM | POA: Diagnosis present

## 2017-04-24 DIAGNOSIS — Z885 Allergy status to narcotic agent status: Secondary | ICD-10-CM

## 2017-04-24 LAB — URINE DRUG SCREEN, QUALITATIVE (ARMC ONLY)
Amphetamines, Ur Screen: POSITIVE — AB
BARBITURATES, UR SCREEN: NOT DETECTED
BENZODIAZEPINE, UR SCRN: NOT DETECTED
CANNABINOID 50 NG, UR ~~LOC~~: NOT DETECTED
COCAINE METABOLITE, UR ~~LOC~~: NOT DETECTED
MDMA (Ecstasy)Ur Screen: NOT DETECTED
METHADONE SCREEN, URINE: NOT DETECTED
OPIATE, UR SCREEN: NOT DETECTED
PHENCYCLIDINE (PCP) UR S: NOT DETECTED
Tricyclic, Ur Screen: NOT DETECTED

## 2017-04-24 LAB — POCT PREGNANCY, URINE: Preg Test, Ur: NEGATIVE

## 2017-04-24 MED ORDER — TRAZODONE HCL 100 MG PO TABS
100.0000 mg | ORAL_TABLET | Freq: Every evening | ORAL | Status: DC | PRN
  Administered 2017-04-29: 100 mg via ORAL
  Filled 2017-04-24 (×2): qty 1

## 2017-04-24 MED ORDER — VENLAFAXINE HCL ER 75 MG PO CP24: 75.0000 mg | ORAL_CAPSULE | Freq: Every day | ORAL | Status: DC

## 2017-04-24 MED ORDER — LURASIDONE HCL 40 MG PO TABS
40.0000 mg | ORAL_TABLET | Freq: Every day | ORAL | Status: DC
  Filled 2017-04-24: qty 1

## 2017-04-24 MED ORDER — MAGNESIUM HYDROXIDE 400 MG/5ML PO SUSP
30.0000 mL | Freq: Every day | ORAL | Status: DC | PRN
Start: 2017-04-24 — End: 2017-04-30

## 2017-04-24 MED ORDER — HYDROXYZINE HCL 25 MG PO TABS
25.0000 mg | ORAL_TABLET | Freq: Three times a day (TID) | ORAL | Status: DC | PRN
  Administered 2017-04-25: 25 mg via ORAL
  Filled 2017-04-24 (×2): qty 1

## 2017-04-24 MED ORDER — VENLAFAXINE HCL ER 75 MG PO CP24
75.0000 mg | ORAL_CAPSULE | Freq: Every day | ORAL | Status: DC
  Administered 2017-04-25 – 2017-04-26 (×2): 75 mg via ORAL
  Filled 2017-04-24 (×3): qty 1

## 2017-04-24 MED ORDER — ALUM & MAG HYDROXIDE-SIMETH 200-200-20 MG/5ML PO SUSP: 30.0000 mL | ORAL | Status: DC | PRN

## 2017-04-24 MED ORDER — LURASIDONE HCL 40 MG PO TABS
40.0000 mg | ORAL_TABLET | Freq: Every day | ORAL | Status: DC
  Administered 2017-04-25: 40 mg via ORAL
  Filled 2017-04-24 (×3): qty 1

## 2017-04-24 MED ORDER — ACETAMINOPHEN 325 MG PO TABS
650.0000 mg | ORAL_TABLET | Freq: Four times a day (QID) | ORAL | Status: DC | PRN
  Administered 2017-04-29 – 2017-04-30 (×2): 650 mg via ORAL
  Filled 2017-04-24 (×2): qty 2

## 2017-04-24 NOTE — ED Notes (Signed)
Attempted to speak with pt again. Pt refusing to make eye contact. Pt is no longer tearful but expression seems more agitated. Asked pt if she wanted to stay or go. Pt is still nonverbal.

## 2017-04-24 NOTE — ED Notes (Signed)
Mother at bedside visiting with patient. Mother removed patient's class ring and will take home with her. Patient states she is unable to remove all earrings at this time. All piercing visualized and intact.

## 2017-04-24 NOTE — ED Notes (Signed)
Patient alert and withdrawn. Patient sister Elmarie Shileyiffany called and she tried to talk with her; however would only say her name and that she was not trying to count numbers when this writer was present. Patient did say to this writer "I want to talk to both of my sisters". Patient would not engage in any further conversation when asked if she hears voices or see things. Patient just sit and give a blank stare to this Clinical research associatewriter. Patient in currently in room sitting on bed. Patient with Q 15 minute checks in progress and patient remains safe on unit. Monitoring of patient continues.

## 2017-04-24 NOTE — ED Notes (Signed)
IVC papers initiated by Dr Paticia Stacklapacs/MD Derrill KayGoodman, RN Vernona RiegerLaura and ODS Officer Teague made aware.

## 2017-04-24 NOTE — ED Notes (Signed)
Patient standing in restroom in underwear staring at the wall and not answering questions by this RN. This RN and Ashlynn, NT in restroom with patient and able to get patient dressed and back in room. Patient speaking excessively softly and not making sense with responses to question. When given commands, patient is able to act on them but appears to struggle to make choices without instructions. Patient dressed and directed back to room where she put on socks and is sitting in bed. Patient given warm blanket. Dr. Toni Amendlapacs at bedside with patient at this time.

## 2017-04-24 NOTE — ED Notes (Signed)
Patients mother called, stated that she visited patient today.  Mother wanted to tell me that she was concerned patient may have had stroke due to her not talking much. Mother states pt. [facility], but stated the patient improved in the past week.  This nurse asked if patient had neuro hx.  Mother denied this.  Mother told that patient would have been medically cleared before accepted into behavior units.

## 2017-04-24 NOTE — ED Notes (Signed)
Patient given phone to speak with mother. Patient sitting up eating breakfast at this time.

## 2017-04-24 NOTE — ED Notes (Signed)
Patient's sister Elmarie Shileyiffany called to speak with patient. Patient in restroom and unable to talk. Told sister patient could call her back when she was available.   Tiffany [phone]

## 2017-04-24 NOTE — BHH Counselor (Signed)
[facility] by Dr. Toni Amendlapacs.  Attending Physician will be Dr. Jennet MaduroPucilowska.   Patient has been assigned to room [facility]B, by [facility] Phyliss.  Intake Paper Work has been signed and placed on patient's chart.  ER staff is aware of the admission: Monique, ER Kathrynn SpeedSectary   Dr. Derrill KayGoodman, ER MD   Marvene StaffGi Gi Patient's Nurse   Weldon InchesJaniel Patient Access.

## 2017-04-24 NOTE — ED Notes (Signed)
Patient is IVC and is pending inpatient admission. 

## 2017-04-24 NOTE — ED Notes (Signed)
Patient would not talk during assessment. Vitals obtained. Matt RN gave report to [facility] for [facility] to [facility] to [facility].

## 2017-04-24 NOTE — ED Notes (Signed)
Knocked on door and told pt that she needed to speed things up. When pt did not respond this NA cracked the door and asked if she was ok. Pt looked confused and asked if she was allowed to use all the supplies that this NA had placed in bathroom, she was told she could use them.

## 2017-04-24 NOTE — ED Notes (Signed)
Pt is awake and laying in bed staring at the ceiling. Pt does not make eye contact or acknowledge this RN. Pt continues to refuse to speak.

## 2017-04-24 NOTE — ED Notes (Signed)
Pt requested to use the phone to speak to her mother. This tech provided pt w/phone. Pt is on the phone talking to her mother.

## 2017-04-24 NOTE — Progress Notes (Signed)
Skin assessment preformed with this Clinical research associatewriter and a MHT Jackie. No areas of concern. Patient have 9 tattoos over multiple areas of body.

## 2017-04-24 NOTE — ED Notes (Signed)
This NA set pt up for a shower, pt currently showering.

## 2017-04-24 NOTE — Progress Notes (Signed)
D: Received patient from BHU. Patient skin assessment completed by KIM RN, skin is intact, no contraband found. Patient has passive SI, states "never on purpose, but it could happen" Patient is forwards very little information, post of admission completed by reviewing chart. Patient is flat, depressed, avoids eye contact. Patient is soft spoken and at times will not respond to questions or requests. Patient has dialog with self at times.   A: Patient oriented to unit/room/call light. Patient was offered support and encouragement. Patient was encourage to attend groups, participate in unit activities and continue with plan of care. Q x 15 minute observation checks were completed for safety.   R: Patient has no complaints at this time. Patient is receptive to treatment and safety maintained on unit.

## 2017-04-24 NOTE — Tx Team (Signed)
Initial Treatment Plan 04/24/2017 8:31 PM Gwynn Harland GermanN Lunde ZOX:[id number]    PATIENT STRESSORS: Financial difficulties Health problems Substance abuse   PATIENT STRENGTHS: Average or above average intelligence Motivation for treatment/growth   PATIENT IDENTIFIED PROBLEMS: Depression 04/24/17  Ineffective coping skills 04/24/17  Substance abuse 04/24/17                 DISCHARGE CRITERIA:  Ability to meet basic life and health needs Adequate post-discharge living arrangements Improved stabilization in mood, thinking, and/or behavior  PRELIMINARY DISCHARGE PLAN: Attend aftercare/continuing care group Attend 12-step recovery group Outpatient therapy  PATIENT/FAMILY INVOLVEMENT: This treatment plan has been presented to and reviewed with the patient, Wende Neighborsandice N Vandruff.  The patient and family have been given the opportunity to ask questions and make suggestions.  Berkley HarveySlade I Macrina Lehnert, RN 04/24/2017, 8:31 PM
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
## 2017-04-24 NOTE — ED Notes (Signed)
Patient given phone to call mother.  

## 2017-04-25 ENCOUNTER — Encounter: Payer: Self-pay | Admitting: Psychiatry

## 2017-04-25 DIAGNOSIS — F315 Bipolar disorder, current episode depressed, severe, with psychotic features: Secondary | ICD-10-CM | POA: Diagnosis present

## 2017-04-25 LAB — LIPID PANEL
CHOL/HDL RATIO: 3.6 ratio
Cholesterol: 167 mg/dL (ref 0–200)
HDL: 46 mg/dL (ref >40)
LDL CALC: 108 mg/dL — AB (ref 0–99)
Triglycerides: 67 mg/dL (ref <150)
VLDL: 13 mg/dL (ref 0–40)

## 2017-04-25 LAB — TSH: TSH: 1.337 u[IU]/mL (ref 0.350–4.500)

## 2017-04-25 LAB — HEMOGLOBIN A1C
Hgb A1c MFr Bld: 5.4 % (ref 4.8–5.6)
Mean Plasma Glucose: 108.28 mg/dL

## 2017-04-25 NOTE — Plan of Care (Signed)
Patient is oriented to unit. Patient's safety is maintained on unit. Patient able to express feels. Patient makes eye contact. Patient is animated and smiling during assessment. Patient voices improvement in mood.    Progressing Education: Knowledge of [facility] Education information/materials will improve 04/25/2017 2334 - Progressing by Addison Naegelieynolds, Erling Arrazola I, RN Emotional status will improve 04/25/2017 2334 - Progressing by Addison Naegelieynolds, Adelfo Diebel I, RN Mental status will improve 04/25/2017 2334 - Progressing by Berkley Harveyeynolds, Jahnae Mcadoo I, RN Self-Concept: Ability to disclose and discuss suicidal ideas will improve 04/25/2017 2334 - Progressing by Addison Naegelieynolds, Maeby Vankleeck I, RN Coping: Ability to cope will improve 04/25/2017 2334 - Progressing by Berkley Harveyeynolds, Christerpher Clos I, RN Ability to verbalize feelings will improve 04/25/2017 2334 - Progressing by Berkley Harveyeynolds, Jennalynn Rivard I, RN

## 2017-04-25 NOTE — Progress Notes (Addendum)
Recreation Therapy Notes  Date: 01.23.2019  Time: 9:30 am  Location: Craft Room  Behavioral response: N/A  Intervention Topic: Team work  Discussion/Intervention: Patient did not attend group. Clinical Observations/Feedback:  Patient did not attend group.   Makyna Niehoff LRT/CTRS         Larae Caison 04/25/2017 12:32 PM 

## 2017-04-25 NOTE — Tx Team (Addendum)
Interdisciplinary Treatment and Diagnostic Plan Update  04/25/2017 Time of Session: 10:55 AM Vicki Black MRN: [id number]  Principal Diagnosis: Bipolar I disorder, current or most recent episode depressed, with psychotic features ([place])  Secondary Diagnoses: Principal Problem:   Bipolar I disorder, current or most recent episode depressed, with psychotic features ([place]) Active Problems:   Acute psychosis ([facility])   Amphetamine use disorder, mild (HCC)   Current Medications:  Current Facility-Administered Medications  Medication Dose Route Frequency Provider Last Rate Last Dose  . acetaminophen (TYLENOL) tablet 650 mg  650 mg Oral Q6H PRN Clapacs, John T, MD      . alum & mag hydroxide-simeth (MAALOX/MYLANTA) 200-200-20 MG/5ML suspension 30 mL  30 mL Oral Q4H PRN Clapacs, John T, MD      . hydrOXYzine (ATARAX/VISTARIL) tablet 25 mg  25 mg Oral TID PRN Clapacs, John T, MD      . lurasidone (LATUDA) tablet 40 mg  40 mg Oral Q supper Clapacs, John T, MD      . magnesium hydroxide (MILK OF MAGNESIA) suspension 30 mL  30 mL Oral Daily PRN Clapacs, John T, MD      . traZODone (DESYREL) tablet 100 mg  100 mg Oral QHS PRN Clapacs, John T, MD      . venlafaxine XR (EFFEXOR-XR) 24 hr capsule 75 mg  75 mg Oral Q breakfast Clapacs, Madie Reno, MD   75 mg at 04/25/17 0750   PTA Medications: Medications Prior to Admission  Medication Sig Dispense Refill Last Dose  . atomoxetine (STRATTERA) 40 MG capsule Take 40 mg by mouth daily after breakfast.   1 Past Week at Unknown time  . azithromycin (ZITHROMAX Z-PAK) 250 MG tablet Take 2 tablets (500 mg) on  Day 1,  followed by 1 tablet (250 mg) once daily on Days 2 through 5. (Patient not taking: Reported on 04/24/2017) 6 each 0 Completed Course at Unknown time  . benzonatate (TESSALON PERLES) 100 MG capsule Take 2 capsules (200 mg total) by mouth 3 (three) times daily as needed. (Patient not taking: Reported on 04/24/2017) 30 capsule 0 Not Taking at Unknown time  .  LATUDA 40 MG TABS tablet Take 40 mg by mouth at bedtime.  1 Past Week at Unknown time  . mirtazapine (REMERON) 15 MG tablet Take 1 tablet (15 mg total) by mouth at bedtime. (Patient not taking: Reported on 04/24/2017) 30 tablet 0 Not Taking at Unknown time  . naproxen (NAPROSYN) 500 MG tablet Take 1 tablet (500 mg total) by mouth 2 (two) times daily with a meal. (Patient not taking: Reported on 04/24/2017) 20 tablet 2 Not Taking at Unknown time    Patient Stressors: Financial difficulties Health problems Substance abuse  Patient Strengths: Average or above average intelligence Motivation for treatment/growth  Treatment Modalities: Medication Management, Group therapy, Case management,  1 to 1 session with clinician, Psychoeducation, Recreational therapy.   Physician Treatment Plan for Primary Diagnosis: Bipolar I disorder, current or most recent episode depressed, with psychotic features ([place]) Long Term Goal(s): Improvement in symptoms so as ready for discharge Improvement in symptoms so as ready for discharge   Short Term Goals: Ability to identify changes in lifestyle to reduce recurrence of condition will improve Ability to verbalize feelings will improve Ability to disclose and discuss suicidal ideas Ability to demonstrate self-control will improve Ability to identify and develop effective coping behaviors will improve Ability to maintain clinical measurements within normal limits will improve Compliance with prescribed medications will improve Ability  to identify triggers associated with substance abuse/mental health issues will improve Ability to identify changes in lifestyle to reduce recurrence of condition will improve Ability to demonstrate self-control will improve Ability to identify triggers associated with substance abuse/mental health issues will improve  Medication Management: Evaluate patient's response, side effects, and tolerance of medication regimen.  Therapeutic  Interventions: 1 to 1 sessions, Unit Group sessions and Medication administration.  Evaluation of Outcomes: Not Met  Physician Treatment Plan for Secondary Diagnosis: Principal Problem:   Bipolar I disorder, current or most recent episode depressed, with psychotic features ([place]) Active Problems:   Acute psychosis ([place])   Amphetamine use disorder, mild ([place])  Long Term Goal(s): Improvement in symptoms so as ready for discharge Improvement in symptoms so as ready for discharge   Short Term Goals: Ability to identify changes in lifestyle to reduce recurrence of condition will improve Ability to verbalize feelings will improve Ability to disclose and discuss suicidal ideas Ability to demonstrate self-control will improve Ability to identify and develop effective coping behaviors will improve Ability to maintain clinical measurements within normal limits will improve Compliance with prescribed medications will improve Ability to identify triggers associated with substance abuse/mental health issues will improve Ability to identify changes in lifestyle to reduce recurrence of condition will improve Ability to demonstrate self-control will improve Ability to identify triggers associated with substance abuse/mental health issues will improve     Medication Management: Evaluate patient's response, side effects, and tolerance of medication regimen.  Therapeutic Interventions: 1 to 1 sessions, Unit Group sessions and Medication administration.  Evaluation of Outcomes: Not Met   RN Treatment Plan for Primary Diagnosis: Bipolar I disorder, current or most recent episode depressed, with psychotic features ([place]) Long Term Goal(s): Knowledge of disease and therapeutic regimen to maintain health will improve  Short Term Goals: Ability to participate in decision making will improve, Ability to verbalize feelings will improve, Ability to identify and develop effective coping behaviors will improve  and Compliance with prescribed medications will improve  Medication Management: RN will administer medications as ordered by provider, will assess and evaluate patient's response and provide education to patient for prescribed medication. RN will report any adverse and/or side effects to prescribing provider.  Therapeutic Interventions: 1 on 1 counseling sessions, Psychoeducation, Medication administration, Evaluate responses to treatment, Monitor vital signs and CBGs as ordered, Perform/monitor CIWA, COWS, AIMS and Fall Risk screenings as ordered, Perform wound care treatments as ordered.  Evaluation of Outcomes: Not Met   LCSW Treatment Plan for Primary Diagnosis: Bipolar I disorder, current or most recent episode depressed, with psychotic features ([place]) Long Term Goal(s): Safe transition to appropriate next level of care at discharge, Engage patient in therapeutic group addressing interpersonal concerns.  Short Term Goals: Engage patient in aftercare planning with referrals and resources and Increase skills for wellness and recovery  Therapeutic Interventions: Assess for all discharge needs, 1 to 1 time with Social worker, Explore available resources and support systems, Assess for adequacy in community support network, Educate family and significant other(s) on suicide prevention, Complete Psychosocial Assessment, Interpersonal group therapy.  Evaluation of Outcomes: Not Met   Progress in Treatment: Attending groups: No. Participating in groups: No. Taking medication as prescribed: No. Toleration medication: No. Family/Significant other contact made: No, will contact:  CSW will contact pt's mother, Alferd Patee Patient understands diagnosis: No. Discussing patient identified problems/goals with staff: No. Medical problems stabilized or resolved: Yes. Denies suicidal/homicidal ideation: Yes. Issues/concerns per patient self-inventory: No. Other: n/a  New  problem(s) identified:  No, Describe:  No new issues identified  New Short Term/Long Term Goal(s): Pt refused to attend tx team today.  Discharge Plan or Barriers: Tentative plan for discharge back to her home with follow-up services to continue with [facility].  Reason for Continuation of Hospitalization: Anxiety Depression Medication stabilization  Estimated Length of Stay: 3-5 days  Recreational Therapy: Patient Stressors: To many choices Patient Goal: Patient will identify 3 new coping skills to use post discharge x5 days.   Attendees: Patient: 04/25/2017 3:38 PM  Physician: Orson Slick, MD 04/25/2017 3:38 PM  Nursing:  04/25/2017 3:38 PM  RN Care Manager: 04/25/2017 3:38 PM  Social Worker: Derrek Gu, LCSW 04/25/2017 3:38 PM  Recreational Therapist: Roanna Epley, LRT 04/25/2017 3:38 PM  Other:  04/25/2017 3:38 PM  Other:  04/25/2017 3:38 PM  Other: 04/25/2017 3:38 PM    Scribe for Treatment Team: Devona Konig, LCSW 04/25/2017 3:38 PM

## 2017-04-25 NOTE — [facility] Suicide Risk Assessment (Signed)
[facility]   Nursing information obtained from:    Demographic factors:    Current Mental Status:    Loss Factors:    Historical Factors:    Risk Reduction Factors:     Total Time spent with patient: 1 hour Principal Problem: Bipolar I disorder, current or most recent episode depressed, with psychotic features [facility](HCC) Diagnosis:   Patient Active Problem List   Diagnosis Date Noted  . Bipolar I disorder, current or most recent episode depressed, with psychotic features (HCC) [F31.5] 04/25/2017    Priority: High  . Acute psychosis (HCC) [F23] 04/24/2017  . 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 Severe recurrent major depression without psychotic features (HCC) [F33.2] 01/05/2016  . Cannabis abuse [F12.10] 01/05/2016  . 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  World Science writerHealth Organization [facility](WHO). Score between 0-7:  no or low risk or alcohol related problems. Score between 8-15:  moderate risk of alcohol related problems. Score between 16-19:  high risk of alcohol related problems. Score 20 or above:  warrants further diagnostic evaluation for alcohol dependence and treatment.   CLINICAL FACTORS:   Bipolar Disorder:   Depressive phase Depression:   Comorbid alcohol abuse/dependence Impulsivity Currently Psychotic   Musculoskeletal: Strength & Muscle Tone: within normal limits Gait & Station: normal Patient leans: N/A  Psychiatric Specialty Exam: Physical Exam  Nursing note and vitals reviewed. Psychiatric: Her affect is blunt. Her speech is delayed. She is withdrawn. Cognition and memory are impaired. She expresses inappropriate  judgment. She expresses suicidal ideation.    Review of Systems  Constitutional: Positive for weight loss.  Neurological: Negative.   Psychiatric/Behavioral: Positive for depression, hallucinations and suicidal ideas.  All other systems reviewed and are negative.   Blood pressure 111/75, pulse 95, temperature 98.3 F (36.8 C), temperature source Oral, resp. rate 16, height 5\' 3"  (1.6 m), weight 49.9 kg (110 lb), SpO2 98 %.Body mass index is 19.49 kg/m.  General Appearance: Casual  Eye Contact:  Good  Speech:  Blocked  Volume:  Decreased  Mood:  Anxious  Affect:  Inappropriate  Thought Process:  NA  Orientation:  NA  Thought Content:  NA  Suicidal Thoughts:  Yes.  without intent/plan  Homicidal Thoughts:  No  Memory:  Immediate;   Poor Recent;   Poor Remote;   Poor  Judgement:  Poor  Insight:  Lacking  Psychomotor Activity:  Psychomotor Retardation  Concentration:  Concentration: Poor and Attention Span: Poor  Recall:  Fair  Fund of Knowledge:  Poor  Language:  Poor  Akathisia:  No  Handed:  Right  AIMS (if indicated):     Assets:  Physical Health Resilience Social Support  ADL's:  Intact  Cognition:  WNL  Sleep:  Number of Hours: 6.25      COGNITIVE FEATURES THAT CONTRIBUTE TO RISK:  None    SUICIDE RISK:   Moderate:  Frequent suicidal ideation with limited intensity, and duration, some specificity in terms of plans, no associated intent, good self-control, limited dysphoria/symptomatology, some risk factors present, and identifiable protective factors, including available and accessible social support.  PLAN OF CARE: hospital admission, medication manmagement, substance abuse counseling, discharge planning.  [name] is a 27 year old female with  a history of depression admitted for psychotic break.  #Suicidal ideation -patient able to contract for safety  #Mood and psychosis -continue Effexor 75 mg daily -continue Latuda 40 mg nightly -Trazodone 100 mg  nightly  #Metabolic syndrome monitoring -Lipid panel, TSH and HgbA1C are normal -EKG -pregnancy test is negative  #Substance abuse -positive for amphetamines  #Disposition -discharge with family -follow up with [facility]  I certify that inpatient services furnished can reasonably be expected to improve the patient's condition.   Kristine Linea, MD 04/25/2017, 8:09 AM

## 2017-04-25 NOTE — Plan of Care (Signed)
Isolating  in room . Tearful  not volunteering  any information to staff .  Sitting in floor of bathroom . Thought process  altered Thought Blocking . Denies suicidal ideations  Patient continually crying no eye contact . Continue to wear scrubs half of the shift.  Patient took shower sat in the floor naked for hour  Before she could start  . Noted to look better this afternoon . Able to  Complete sentences   Not Progressing Education: Knowledge of [place] [facility] Education information/materials will improve 04/25/2017 1054 - Not Progressing by Crist InfanteFarrish, Beauford Lando A, RN Emotional status will improve 04/25/2017 1054 - Not Progressing by Crist InfanteFarrish, Mcadoo Muzquiz A, RN Mental status will improve 04/25/2017 1054 - Not Progressing by Crist InfanteFarrish, Marra Fraga A, RN Verbalization of understanding the information provided will improve 04/25/2017 1054 - Not Progressing by Crist InfanteFarrish, Amiri Riechers A, RN Self-Concept: Ability to disclose and discuss suicidal ideas will improve 04/25/2017 1054 - Not Progressing by Crist InfanteFarrish, Tamecca Artiga A, RN Ability to verbalize positive feelings about self will improve 04/25/2017 1054 - Not Progressing by Crist InfanteFarrish, Mohammad Granade A, RN Coping: Ability to cope will improve 04/25/2017 1054 - Not Progressing by Crist InfanteFarrish, Daved Mcfann A, RN Ability to verbalize feelings will improve 04/25/2017 1054 - Not Progressing by Crist InfanteFarrish, Samael Blades A, RN   unable to say any thing good about her self  Self-Concept: Ability to disclose and discuss suicidal ideas will improve 04/25/2017 1054 - Not Progressing by Crist InfanteFarrish, Jeren Dufrane A, RN

## 2017-04-25 NOTE — Progress Notes (Signed)
Recreation Therapy Notes  INPATIENT RECREATION THERAPY ASSESSMENT  Patient Details Name: Vicki NeighborsCandice N Vorhees MRN: [id number] DOB: 04/30/1990 Today's Date: 04/25/2017   Patient was pleasant and up beat during assessment. She laughed and made jokes. Individual mumbled during part of the assessment.Patient asked questions about things on the unit and the phone schedule.   Patient Stressors: Other (Comment)(To many choices)  Coping Skills:   Music, Other (Comment)(Coloring)  Personal Challenges: Communication, Concentration, Expressing Yourself, Problem-Solving, Social Interaction, Stress Management, Time Management, Trusting Others  Leisure Interests (2+):  Art - MudloggerColoring, Music - Listen  Awareness of Community Resources:  Yes  Community Resources:  [employer], [employer]  Current Use: Yes  If no, Barriers?:    Patient Strengths:  N/A  Patient Identified Areas of Improvement:  Be a better person  Current Recreation Participation:  Exercise  Patient Goal for Hospitalization:  Get a job and some good sleep.  [place] Residence[place]  County of Residence:  [place]   Current SI (including self-harm):  No  Current HI:  No  Consent to Intern Participation: N/A   Rual Vermeer 04/25/2017, 4:30 PM

## 2017-04-25 NOTE — Progress Notes (Signed)
Patient ID: Vicki Black, female   DOB: 09/25/1990, 27 y.o.   MRN: [id number] CSW attempted to meet with pt to complete PSA and obtain consents.  CSW was able to obtain consent to contact pt's mother, Vicki Black, but was unable to engage pt in an interview to complete the PSA. Pt was very disorganized in her thought process and was unable to engage in logical conversation with CSW. CSW will attempt to meet with pt again on 04/26/17.
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
## 2017-04-25 NOTE — [facility] Group Notes (Signed)
  04/25/2017  Time: 1:00PM  Type of Therapy/Topic:  Group Therapy:  Emotion Regulation  Participation Level:  Did Not Attend   Description of Group:    The purpose of this group is to assist patients in learning to regulate negative emotions and experience positive emotions. Patients will be guided to discuss ways in which they have been vulnerable to their negative emotions. These vulnerabilities will be juxtaposed with experiences of positive emotions or situations, and patients will be challenged to use positive emotions to combat negative ones. Special emphasis will be placed on coping with negative emotions in conflict situations, and patients will process healthy conflict resolution skills.  Therapeutic Goals: 1. Patient will identify two positive emotions or experiences to reflect on in order to balance out negative emotions 2. Patient will label two or more emotions that they find the most difficult to experience 3. Patient will demonstrate positive conflict resolution skills through discussion and/or role plays  Summary of Patient Progress: Pt was invited to attend group but chose not to attend. CSW will continue to encourage pt to attend group throughout their admission.    Therapeutic Modalities:   Cognitive Behavioral Therapy Feelings Identification Dialectical Behavioral Therapy  Heidi DachKelsey Zyler Hyson, MSW, LCSW 04/25/2017 1:43 PM

## 2017-04-25 NOTE — Progress Notes (Signed)
Recreation Therapy Notes  Date: 01.23 .2018  Time: 3:00pm  Location: Craft room  Behavioral response: Appropriate  Group Type: Craft  Participation level: Active  Communication: Patient was social with peers and staff.  Comments: N/A  Montford Barg LRT/CTRS        Graviela Nodal 04/25/2017 4:10 PM 

## 2017-04-25 NOTE — Progress Notes (Signed)
D: Patient denies SI/HI/AVH. Patient is direct and animated during assessment. Patient is in milieu interacting with peers and has time with visitors. Patient is smiling and makes direct eye contact. Patient has complaint of mild anxiety related to new environment. Patient states, "I am sorry I was so rude to you yesterday when you were asking your questions, I am ready to start the program. I want the medications I need to take and I am ready to get better."  A: Patient was assessed by this nurse. Patient encouraged to report any thoughts of SI/HI/AVH to staff.  Q x 15 minute observation checks were completed for safety. Patient was provided with verbal education on provided medications. Patient care plan was reviewed. Patient was offered support and encouragement. Patient was encourage to attend groups, participate in unit activities and continue with plan of care.   R: Patient responded well to PRN medications and symptoms were relieved. Patient has no complaints of pain at this time. Patient is receptive to treatment and safety maintained on unit.

## 2017-04-26 MED ORDER — LURASIDONE HCL 40 MG PO TABS
80.0000 mg | ORAL_TABLET | Freq: Every day | ORAL | Status: DC
  Administered 2017-04-26 – 2017-04-27 (×2): 80 mg via ORAL
  Filled 2017-04-26 (×3): qty 2

## 2017-04-26 NOTE — [facility] Group Notes (Signed)
LCSW Group Therapy Note 04/26/2017 9:00 AM  Type of Therapy and Topic:  Group Therapy:  Setting Goals  Participation Level:  Minimal  Description of Group: In this process group, patients discussed using strengths to work toward goals and address challenges.  Patients identified two positive things about themselves and one goal they were working on.  Patients were given the opportunity to share openly and support each other's plan for self-empowerment.  The group discussed the value of gratitude and were encouraged to have a daily reflection of positive characteristics or circumstances.  Patients were encouraged to identify a plan to utilize their strengths to work on current challenges and goals.  Therapeutic Goals 1. Patient will verbalize personal strengths/positive qualities and relate how these can assist with achieving desired personal goals 2. Patients will verbalize affirmation of peers plans for personal change and goal setting 3. Patients will explore the value of gratitude and positive focus as related to successful achievement of goals 4. Patients will verbalize a plan for regular reinforcement of personal positive qualities and circumstances.  Summary of Patient Progress:  Vicki Black attempted to participate in group.  She stated that she understand how she can make her own SMART goals.  Vicki Black shared that her goal for today is "to attempt goals and try to be on time".     Therapeutic Modalities Cognitive Behavioral Therapy Motivational Interviewing    Alease FrameSonya S Merl [place], [place] 04/26/2017 11:34 AM

## 2017-04-26 NOTE — Plan of Care (Signed)
Patient up ad lib on the unit with steady gait. Patient has displayed some disorientation today to this Clinical research associatewriter. Patient not able to recall the month, date, year, place or situation. Patient states,"I don't like the way that the medication is making me feel, I feel drowsy." The possible side effects of the medication reviewed with patient and patient was encouraged to lie down until the drowsiness past. Patient's mother also called to verbalize her concern for the "mental state" that patient is in. Mother states, "She didn't start acting this way until she started taking that Latuda." Contact information left for MD to contact patient's mother. Milieu remains safe with q 15 minute safety checks.

## 2017-04-26 NOTE — Plan of Care (Signed)
  Progressing Education: Knowledge of [facility] Education information/materials will improve 04/26/2017 2333 - Progressing by Merlene PullingBrigman, Jacob Cicero A, RN Emotional status will improve 04/26/2017 2333 - Progressing by Merlene PullingBrigman, Marne Meline A, RN Mental status will improve 04/26/2017 2333 - Progressing by Merlene PullingBrigman, Sangeeta Youse A, RN Verbalization of understanding the information provided will improve 04/26/2017 2333 - Progressing by Merlene PullingBrigman, Marget Outten A, RN Self-Concept: Ability to disclose and discuss suicidal ideas will improve 04/26/2017 2333 - Progressing by Merlene PullingBrigman, Elick Aguilera A, RN Ability to verbalize positive feelings about self will improve 04/26/2017 2333 - Progressing by Merlene PullingBrigman, Mahesh Sizemore A, RN Coping: Ability to cope will improve 04/26/2017 2333 - Progressing by Merlene PullingBrigman, Ndia Sampath A, RN Ability to verbalize feelings will improve 04/26/2017 2333 - Progressing by Merlene PullingBrigman, Starlynn Klinkner A, RN [facility] Participation in Recreation Therapeutic Interventions ST[facility]-Other Recreation Therapy Goal (Specify) Description Patient will identify 3 new coping skills to use post discharge x5 days.  04/26/2017 2333 - Progressing by Merlene PullingBrigman, Jyl [place] A, RN
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
## 2017-04-26 NOTE — Progress Notes (Signed)
Recreation Therapy Notes   Date: 01.24.2019  Time: 1:00 PM  Location: Craft Room  Behavioral response: Appropriate   Intervention Topic: Problem Solving  Discussion/Intervention: Group content on today was focused on problem solving. The group described what problem solving is. Patients expressed how problems affect them and how they deal with problems. Individuals identified healthy ways to deal with problems. Patients explained what normally happens to them when they do not deal with problems. The group expressed reoccurring problems for them. The group participated in the intervention "Ways to Solve problems" where patients were given a chance to explore different ways to solve problems.  Clinical Observations/Feedback:  Patient was with Doctor during group. She stopped by after group to ask about the topic for the day and anything she missed during group.   Sohrab Keelan LRT/CTRS          Ritchard Paragas 04/26/2017 2:13 PM

## 2017-04-26 NOTE — Progress Notes (Signed)
Patient up ad lib on the unit with steady gait. Patient has displayed some disorientation today to this writer. Patient not able to recall the month, date, year, place or situation. Patient states,"I don't like the way that the medication is making me feel, I feel drowsy." The possible side effects of the medication reviewed with patient and patient was encouraged to lie down until the drowsiness past. Patient's mother also called to verbalize her concern for the "mental state" that patient is in. Mother states, "She didn't start acting this way until she started taking that Latuda." Contact information left for MD to contact patient's mother. Milieu remains safe with q 15 minute safety checks. 

## 2017-04-26 NOTE — Progress Notes (Signed)
[facility] MD Progress Note  04/26/2017 2:55 PM Vicki Black  MRN:  [id number]  Subjective:   Vicki Black is up and about today but terribly disorganized, still unable to answer simple questions, confused about her timelines. She knows it is January 24. There are no somatic complaints.  Treatment plan. She was started on Effexor and Latuda in the ER. She questions Effexor "why two medications for depression?". Her mother questions latuda blaming medications for her symptoms.  Social/disposition. TBE. SW to call CPS about her case.  Principal Problem: Bipolar I disorder, current or most recent episode depressed, with psychotic features [facility]) Diagnosis:   Patient Active Problem List   Diagnosis Date Noted  . Bipolar I disorder, current or most recent episode depressed, with psychotic features (HCC) [F31.5] 04/25/2017    Priority: High  . Acute psychosis (HCC) [F23] 04/24/2017  . 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 Severe recurrent major depression without psychotic features (HCC) [F33.2] 01/05/2016  . Cannabis abuse [F12.10] 01/05/2016  . 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 Anxiety   . Back pain   . Bursitis of hip    trochanteric bursitis of both hips    Past Surgical History:  Procedure Laterality Date  . DENTAL SURGERY    . TUBAL LIGATION     Family History: History reviewed. No pertinent family history. Family Psychiatric  History: mother with mental illness Social History:  Social History   Substance and Sexual Activity  Alcohol Use Yes   Comment: socially     Social History   Substance and Sexual Activity  Drug Use Yes  . Types: Marijuana    Social History   Socioeconomic History  . Marital status: Single    Spouse  name: None  . Number of children: None  . Years of education: None  . Highest education level: None  Social Needs  . Financial resource strain: None  . Food insecurity - worry: None  . Food insecurity - inability: None  . Transportation needs - medical: None  . Transportation needs - non-medical: None  Occupational History  . None  Tobacco Use  . Smoking status: Former Games developer  . Smokeless tobacco: Never Used  Substance and Sexual Activity  . Alcohol use: Yes    Comment: socially  . Drug use: Yes    Types: Marijuana  . Sexual activity: Yes    Birth control/protection: Surgical  Other Topics Concern  . None  Social History Narrative  . None   Additional Social History:                         Sleep: Fair  Appetite:  Fair  Current Medications: Current Facility-Administered Medications  Medication Dose Route Frequency Provider Last Rate Last Dose  . acetaminophen (TYLENOL) tablet 650 mg  650 mg Oral Q6H PRN Clapacs, John T, MD      . alum & mag hydroxide-simeth (MAALOX/MYLANTA) 200-200-20 MG/5ML suspension 30 mL  30 mL Oral Q4H PRN Clapacs, John T, MD      . hydrOXYzine (ATARAX/VISTARIL) tablet 25 mg  25 mg Oral TID PRN Clapacs, Jackquline Denmark, MD   25 mg at 04/25/17 2146  . lurasidone (LATUDA) tablet 40 mg  40 mg Oral Q supper  Clapacs, Jackquline DenmarkJohn T, MD   40 mg at 04/25/17 1641  . magnesium hydroxide (MILK OF MAGNESIA) suspension 30 mL  30 mL Oral Daily PRN Clapacs, Jackquline DenmarkJohn T, MD      . traZODone (DESYREL) tablet 100 mg  100 mg Oral QHS PRN Clapacs, John T, MD      . venlafaxine XR (EFFEXOR-XR) 24 hr capsule 75 mg  75 mg Oral Q breakfast Clapacs, Jackquline DenmarkJohn T, MD   75 mg at 04/26/17 [id number]    Lab Results:  Results for orders placed or performed during the hospital encounter of 04/24/17 (from the past 48 hour(s))  Hemoglobin A1c     Status: None   Collection Time: 04/25/17  7:40 AM  Result Value Ref Range   Hgb A1c MFr Bld 5.4 4.8 - 5.6 %    Comment: (NOTE) Pre diabetes:           5.7%-6.4% Diabetes:              >6.4% Glycemic control for   <7.0% adults with diabetes    Mean Plasma Glucose 108.28 mg/dL    Comment: Performed at [facility], [address] [place], [place] [address]  Lipid panel     Status: Abnormal   Collection Time: 04/25/17  7:40 AM  Result Value Ref Range   Cholesterol 167 0 - 200 mg/dL   Triglycerides 67 <409<150 mg/dL   HDL 46 >81>40 mg/dL   Total CHOL/HDL Ratio 3.6 RATIO   VLDL 13 0 - 40 mg/dL   LDL Cholesterol 191108 (H) 0 - 99 mg/dL    Comment:        Total Cholesterol/HDL:CHD Risk Coronary Heart Disease Risk Table                     Men   Women  1/2 Average Risk   3.4   3.3  Average Risk       5.0   4.4  2 X Average Risk   9.6   7.1  3 X Average Risk  23.4   11.0        Use the calculated Patient Ratio above and the CHD Risk Table to determine the patient's CHD Risk.        ATP III CLASSIFICATION (LDL):  <100     mg/dL   Optimal  [id number]  mg/dL   Near or Above                    Optimal  130-159  mg/dL   Borderline  [id number]  mg/dL   High  >657>190     mg/dL   Very High Performed at [facility], [address] [place], [place] [address]   TSH     Status: None   Collection Time: 04/25/17  7:40 AM  Result Value Ref Range   TSH 1.337 0.350 - 4.500 uIU/mL    Comment: Performed by a 3rd Generation assay with a functional sensitivity of <=0.01 uIU/mL. Performed at [facility], [address] [place], [place] [address]     Blood Alcohol level:  Lab Results  Component Value Date   [facility] <10 04/23/2017   ETH <5 01/04/2016    Metabolic Disorder Labs: Lab Results  Component Value Date   HGBA1C 5.4 04/25/2017   MPG 108.28 04/25/2017   No results found for: PROLACTIN Lab Results  Component Value Date   CHOL 167 04/25/2017   TRIG 67 04/25/2017  HDL 46 04/25/2017   CHOLHDL 3.6 04/25/2017   VLDL 13 04/25/2017   LDLCALC 108 (H) 04/25/2017    Physical Findings: AIMS:  , ,  ,  ,    CIWA:    COWS:      Musculoskeletal: Strength & Muscle Tone: within normal limits Gait & Station: normal Patient leans: N/A  Psychiatric Specialty Exam: Physical Exam  Nursing note and vitals reviewed. Psychiatric: Her mood appears anxious. Her speech is delayed. She is actively hallucinating. Thought content is delusional. Cognition and memory are impaired. She expresses impulsivity. She exhibits a depressed mood.    Review of Systems  Neurological: Negative.   Psychiatric/Behavioral: Positive for depression, hallucinations and memory loss. The patient is nervous/anxious.   All other systems reviewed and are negative.   Blood pressure 114/79, pulse 98, temperature 97.8 F (36.6 C), temperature source Oral, resp. rate 16, height 5\' 3"  (1.6 m), weight 49.9 kg (110 lb), SpO2 98 %.Body mass index is 19.49 kg/m.  General Appearance: Casual  Eye Contact:  Good  Speech:  Slow  Volume:  Normal  Mood:  Anxious and Depressed  Affect:  Tearful  Thought Process:  Disorganized and Descriptions of Associations: Tangential  Orientation:  Full (Time, Place, and Person)  Thought Content:  Delusions, Hallucinations: Auditory Visual and Paranoid Ideation  Suicidal Thoughts:  No  Homicidal Thoughts:  No  Memory:  Immediate;   Fair Recent;   Fair Remote;   Fair  Judgement:  Poor  Insight:  Lacking  Psychomotor Activity:  Psychomotor Retardation  Concentration:  Concentration: Poor and Attention Span: Poor  Recall:  Poor  Fund of Knowledge:  Poor  Language:  Fair  Akathisia:  No  Handed:  Right  AIMS (if indicated):     Assets:  Communication Skills Desire for Improvement Financial Resources/Insurance Physical Health Resilience Social Support  ADL's:  Intact  Cognition:  WNL  Sleep:  Number of Hours: 6.25     Treatment Plan Summary: Daily contact with patient to assess and evaluate symptoms and progress in treatment and Medication management   Ms. Ogan is a 27 year old female with a history  of depression admitted for psychotic break.  #Suicidal ideation -patient able to contract for safety  #Mood and psychosis -continue Effexor 75 mg daily -increase Latuda to 80 mg with supper  -Trazodone 100 mg nightly  #Metabolic syndrome monitoring -Lipid panel, TSH and HgbA1C are normal -EKG -pregnancy test is negative  #Substance abuse -positive for amphetamines  #Disposition -discharge with family -follow up with [facility]     Kristine Linea, MD 04/26/2017, 2:55 PM

## 2017-04-26 NOTE — Progress Notes (Signed)
Pt mother visit today. Mother continues to express she think JordanLatuda has cause her daughter to be non-coherent. Pt appears to be very drowsy and unable to articulate a full sentence. Will cont to monitor pt. .Marland Kitchen

## 2017-04-26 NOTE — BHH Group Notes (Signed)
04/26/2017  Time: 0930  Type of Therapy/Topic:  Group Therapy:  Balance in Life  Participation Level:  Active  Description of Group:   This group will address the concept of balance and how it feels and looks when one is unbalanced. Patients will be encouraged to process areas in their lives that are out of balance and identify reasons for remaining unbalanced. Facilitators will guide patients in utilizing problem-solving interventions to address and correct the stressor making their life unbalanced. Understanding and applying boundaries will be explored and addressed for obtaining and maintaining a balanced life. Patients will be encouraged to explore ways to assertively make their unbalanced needs known to significant others in their lives, using other group members and facilitator for support and feedback.  Therapeutic Goals: 1. Patient will identify two or more emotions or situations they have that consume much of in their lives. 2. Patient will identify signs/triggers that life has become out of balance:  3. Patient will identify two ways to set boundaries in order to achieve balance in their lives:  4. Patient will demonstrate ability to communicate their needs through discussion and/or role plays  Summary of Patient Progress: Pt continues to work towards their tx goals but has not yet reached them. Pt was able to appropriately participate in group discussion, and was able to offer support/validation to other group members. Pt reported she feels she devotes an appropriate amount of time to her family, but she can spend too much time worrying about them at times. Pt reported one way to maintain a healthy life balance is to engage in self care.  Therapeutic Modalities:   Cognitive Behavioral Therapy Solution-Focused Therapy Assertiveness Training  Heidi DachKelsey Zaniah [place]ington, MSW, LCSW 04/26/2017 10:36 AM

## 2017-04-26 NOTE — BHH Suicide Risk Assessment (Addendum)
B[facility] INPATIENT:  Family/Significant Other Suicide Prevention Education  Suicide Prevention Education:  Education Completed; Collier Salinaamera Thorberg (mother at [phone]) has been identified by the patient as the family member/significant other with whom the patient will be residing, and identified as the person(s) who will aid the patient in the event of a mental health crisis (suicidal ideations/suicide attempt).  With written consent from the patient, the family member/significant other has been provided the following suicide prevention education, prior to the and/or following the discharge of the patient.  The suicide prevention education provided includes the following:  Suicide risk factors  Suicide prevention and interventions  National Suicide Hotline telephone number  [facility] assessment telephone number  [facility] Emergency Assistance 911  [facility] and/or Residential Mobile Crisis Unit telephone number  Request made of family/significant other to:  Remove weapons (e.g., guns, rifles, knives), all items previously/currently identified as safety concern.    Remove drugs/medications (over-the-counter, prescriptions, illicit drugs), all items previously/currently identified as a safety concern.  The family member/significant other verbalizes understanding of the suicide prevention education information provided.  The family member/significant other agrees to remove the items of safety concern listed above.  Ms. Jennye Boroughshorberg is listed as pt's legal guardian and her primary support.  Ms. Jennye Boroughshorberg stated that pt may be returning to her home, but may also go to live with her friend in [place] following discharge.  She reported that there are no weapons or other safety concerns in her home or her best friend's home to her knowledge.  Alease FrameSonya S Vinie Charity, LCSW 04/26/2017, 1:32 PM

## 2017-04-27 NOTE — BHH Group Notes (Signed)
B[facility] Group Notes:  (Nursing/MHT/Case Management/Adjunct)  Date:  04/27/2017  Time:  2:47 PM  Type of Therapy:  Psychoeducational Skills  Participation Level:  None  Ophelia ShoulderFlorence D Zimmerman 04/27/2017, 2:47 PM

## 2017-04-27 NOTE — Plan of Care (Signed)
  Progressing Education: Emotional status will improve 04/27/2017 2223 - Progressing by Galen ManilaVigil, Brieanne Mignone E, RN Mental status will improve 04/27/2017 2223 - Progressing by Galen ManilaVigil, Deem Marmol E, RN Coping: Ability to cope will improve 04/27/2017 2223 - Progressing by Galen ManilaVigil, Lavita Pontius E, RN

## 2017-04-27 NOTE — Progress Notes (Signed)
[facility] MD Progress Note  04/27/2017 4:06 PM Vicki Black  MRN:  [id number]  Subjective:  Vicki Black met with treatment team today. She is still very disorganized and unable to provide much information. Unable to participate in discharge planning. Refuses Effexor takes [place].  Treatment plan. Continue Latuda 80 mg daily.  Social/dipsoition. Has family here including children but has been transient/homeless.  Principal Problem: Bipolar I disorder, current or most recent episode depressed, with psychotic features [facility]) Diagnosis:   Patient Active Problem List   Diagnosis Date Noted  . Bipolar I disorder, current or most recent episode depressed, with psychotic features ([place]) [F31.5] 04/25/2017    Priority: High  . Acute psychosis ([place]) [F23] 04/24/2017  . Amphetamine use disorder, mild ([place]) [F15.10] 04/24/2017  . Brief reactive psychosis ([place]) [F23] 04/24/2017  . Severe recurrent major depression without psychotic features ([place]) [F33.2] 01/05/2016  . Cannabis abuse [F12.10] 01/05/2016  . 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  Past Medical History:  Past Medical History:  Diagnosis Date  . Anxiety   . Back pain   . Bursitis of hip    trochanteric bursitis of both hips    Past Surgical History:  Procedure Laterality Date  . DENTAL SURGERY    . TUBAL LIGATION     Family History: History reviewed. No pertinent family history. Family Psychiatric  History: depression. Social History:  Social History   Substance and Sexual Activity  Alcohol Use Yes   Comment: socially     Social History   Substance and Sexual Activity  Drug Use Yes  . Types: Marijuana    Social History   Socioeconomic History  . Marital status: Single    Spouse name: None  . Number of children: None  . Years of education: None  . Highest education level: None  Social Needs  .  Financial resource strain: None  . Food insecurity - worry: None  . Food insecurity - inability: None  . Transportation needs - medical: None  . Transportation needs - non-medical: None  Occupational History  . None  Tobacco Use  . Smoking status: Former Research scientist (life sciences)  . Smokeless tobacco: Never Used  Substance and Sexual Activity  . Alcohol use: Yes    Comment: socially  . Drug use: Yes    Types: Marijuana  . Sexual activity: Yes    Birth control/protection: Surgical  Other Topics Concern  . None  Social History Narrative  . None   Additional Social History:                         Sleep: Fair  Appetite:  Fair  Current Medications: Current Facility-Administered Medications  Medication Dose Route Frequency Provider Last Rate Last Dose  . acetaminophen (TYLENOL) tablet 650 mg  650 mg Oral Q6H PRN Clapacs, John T, MD      . alum & mag hydroxide-simeth (MAALOX/MYLANTA) 200-200-20 MG/5ML suspension 30 mL  30 mL Oral Q4H PRN Clapacs, John T, MD      . hydrOXYzine (ATARAX/VISTARIL) tablet 25 mg  25 mg Oral TID PRN Clapacs, Madie Reno, MD   25 mg at 04/25/17 2146  . lurasidone (LATUDA) tablet 80 mg  80 mg Oral Q supper Pandora Mccrackin B, MD   80 mg at 04/26/17 1635  . magnesium hydroxide (MILK OF MAGNESIA) suspension 30 mL  30 mL Oral Daily  PRN Clapacs, Madie Reno, MD      . traZODone (DESYREL) tablet 100 mg  100 mg Oral QHS PRN Clapacs, John T, MD      . venlafaxine XR (EFFEXOR-XR) 24 hr capsule 75 mg  75 mg Oral Q breakfast Clapacs, Madie Reno, MD   75 mg at 04/26/17 0160    Lab Results: No results found for this or any previous visit (from the past 48 hour(s)).  Blood Alcohol level:  Lab Results  Component Value Date   ETH <10 04/23/2017   ETH <5 10/93/2355    Metabolic Disorder Labs: Lab Results  Component Value Date   HGBA1C 5.4 04/25/2017   MPG 108.28 04/25/2017   No results found for: PROLACTIN Lab Results  Component Value Date   CHOL 167 04/25/2017   TRIG 67  04/25/2017   HDL 46 04/25/2017   CHOLHDL 3.6 04/25/2017   VLDL 13 04/25/2017   LDLCALC 108 (H) 04/25/2017    Physical Findings: AIMS:  , ,  ,  ,    CIWA:    COWS:     Musculoskeletal: Strength & Muscle Tone: within normal limits Gait & Station: normal Patient leans: N/A  Psychiatric Specialty Exam: Physical Exam  Nursing note and vitals reviewed. Psychiatric: Her speech is normal. Her affect is labile and inappropriate. She is slowed. Cognition and memory are impaired. She expresses impulsivity.    Review of Systems  Neurological: Negative.   Psychiatric/Behavioral: Positive for substance abuse.  All other systems reviewed and are negative.   Blood pressure 122/74, pulse 92, temperature 98.4 F (36.9 C), temperature source Oral, resp. rate 16, height '5\' 3"'  (1.6 m), weight 49.9 kg (110 lb), SpO2 98 %.Body mass index is 19.49 kg/m.  General Appearance: Casual  Eye Contact:  Good  Speech:  Slow  Volume:  Decreased  Mood:  Irritable  Affect:  Congruent  Thought Process:  Disorganized and Descriptions of Associations: Tangential  Orientation:  Full (Time, Place, and Person)  Thought Content:  Illogical  Suicidal Thoughts:  No  Homicidal Thoughts:  No  Memory:  Immediate;   Poor Recent;   Poor Remote;   Poor  Judgement:  Poor  Insight:  Lacking  Psychomotor Activity:  Normal  Concentration:  Concentration: Poor and Attention Span: Poor  Recall:  Poor  Fund of Knowledge:  Poor  Language:  Poor  Akathisia:  No  Handed:  Right  AIMS (if indicated):     Assets:  Communication Skills Desire for Improvement Physical Health Resilience  ADL's:  Intact  Cognition:  WNL  Sleep:  Number of Hours: 8.15     Treatment Plan Summary: Daily contact with patient to assess and evaluate symptoms and progress in treatment and Medication management   Vicki Black is a 27 year old female with a history of depression admitted for psychotic break. Positive for amphetamines. Very  disorganized.  #Suicidal ideation -patient able to contract for safety  #Mood and psychosis -discontinue Effexor 75 mg daily, patient refuses -continue Latuda 80 mg with supper  -Trazodone 100 mg nightly  #Metabolic syndrome monitoring -Lipid panel, TSH and HgbA1C are normal -EKG -pregnancy test is negative  #Substance abuse -positive for amphetamines -denies substance use  #Disposition -discharge with family -follow up with [facility]    Orson Slick, MD 04/27/2017, 4:06 PM

## 2017-04-27 NOTE — Progress Notes (Signed)
   04/27/17 1500  Clinical Encounter Type  Visited With Patient  Visit Type Other (Comment) (group)  Spiritual Encounters  Spiritual Needs Emotional   Chaplain facilitated group in which patient participated.  Group involved communication skills and relaxation/visualization techniques. 

## 2017-04-27 NOTE — Plan of Care (Signed)
Patient's mental status is improving. Patient is alert and oriented to self, place and time. Speech is louder and coherent today in comparison to yesterday. Affect is brighter and is present on the milieu observed interacting with peers. States, "I feel a lot better today." Patient did however declined to take her Effexor and verbalized that it made her feel drowsy. Attends groups and actively participates in the setting. Milieu remains safe with q 15 minute safety checks.

## 2017-04-27 NOTE — Progress Notes (Signed)
Patient found in day room upon my arrival. Patient had visit with her mother. Requests that her necklaces be given to her mother. Patient is visible but not social throughout the evening. Denies SI, HI, AVH, and anxiety. Reports, "I'm just tired." Denies necessity of Trazodone for sleep. Patient has no HS medications scheduled. Compliant with staff direction. Denies pain. Q 15 minute checks maintained. Will continue to monitor throughout the shift.

## 2017-04-27 NOTE — Progress Notes (Signed)
Patient's mental status is improving. Patient is alert and oriented to self, place and time. Speech is louder and coherent today in comparison to yesterday. Affect is brighter and is present on the milieu observed interacting with peers. States, "I feel a lot better today." Patient did however declined to take her Effexor and verbalized that it made her feel drowsy. Attends groups and actively participates in the setting. Milieu remains safe with q 15 minute safety checks. 

## 2017-04-27 NOTE — Progress Notes (Signed)
Recreation Therapy Notes  Date: 01.25.2019  Time: 1:00 PM  Location: Craft Room  Behavioral response: Appropriate   Intervention Topic: Time Management  Discussion/Intervention: Group content today was focused on time management. The group defined time management and identified healthy ways to manage time. Individuals expressed how much of the 24 hours they use in a day. Patients expressed how much time they use just for themselves personally. The group expressed how they have managed their time in the past. Individuals participated in the intervention "Managing Life" where they had a chance to see how much of the 24 hours they use and where it goes. Clinical Observations/Feedback:  Patient came to group and defined time management as being on time. She stated that she normally uses 12 hours out of her 24hour day. Individual explained that only 2-3 hours are personally for her. Patient states some people do not manage their time properly because they do not have a watch. She stated a way she can manage her time better in the future is to use a calendar. Individual participated in the intervention and was social with peers and staff.   Nyzir Dubois LRT/CTRS         Vicki Black 04/27/2017 2:34 PM

## 2017-04-27 NOTE — Tx Team (Signed)
Interdisciplinary Treatment and Diagnostic Plan Update  04/27/2017 Time of Session: 10:50 AM Vicki Black MRN: [id number]  Principal Diagnosis: Bipolar I disorder, current or most recent episode depressed, with psychotic features (HCC)  Secondary Diagnoses: Principal Problem:   Bipolar I disorder, current or most recent episode depressed, with psychotic features (HCC) Active Problems:   Acute psychosis (HCC)   Amphetamine use disorder, mild (HCC)   Current Medications:  Current Facility-Administered Medications  Medication Dose Route Frequency Provider Last Rate Last Dose  . acetaminophen (TYLENOL) tablet 650 mg  650 mg Oral Q6H PRN Clapacs, John T, MD      . alum & mag hydroxide-simeth (MAALOX/MYLANTA) 200-200-20 MG/5ML suspension 30 mL  30 mL Oral Q4H PRN Clapacs, John T, MD      . hydrOXYzine (ATARAX/VISTARIL) tablet 25 mg  25 mg Oral TID PRN Clapacs, Jackquline Denmark, MD   25 mg at 04/25/17 2146  . lurasidone (LATUDA) tablet 80 mg  80 mg Oral Q supper Pucilowska, Jolanta B, MD   80 mg at 04/27/17 1721  . magnesium hydroxide (MILK OF MAGNESIA) suspension 30 mL  30 mL Oral Daily PRN Clapacs, John T, MD      . traZODone (DESYREL) tablet 100 mg  100 mg Oral QHS PRN Clapacs, John T, MD      . venlafaxine XR (EFFEXOR-XR) 24 hr capsule 75 mg  75 mg Oral Q breakfast Clapacs, Jackquline Denmark, MD   75 mg at 04/26/17 4098   PTA Medications: Medications Prior to Admission  Medication Sig Dispense Refill Last Dose  . atomoxetine (STRATTERA) 40 MG capsule Take 40 mg by mouth daily after breakfast.   1 Past Week at Unknown time  . azithromycin (ZITHROMAX Z-PAK) 250 MG tablet Take 2 tablets (500 mg) on  Day 1,  followed by 1 tablet (250 mg) once daily on Days 2 through 5. (Patient not taking: Reported on 04/24/2017) 6 each 0 Completed Course at Unknown time  . benzonatate (TESSALON PERLES) 100 MG capsule Take 2 capsules (200 mg total) by mouth 3 (three) times daily as needed. (Patient not taking: Reported on  04/24/2017) 30 capsule 0 Not Taking at Unknown time  . LATUDA 40 MG TABS tablet Take 40 mg by mouth at bedtime.  1 Past Week at Unknown time  . mirtazapine (REMERON) 15 MG tablet Take 1 tablet (15 mg total) by mouth at bedtime. (Patient not taking: Reported on 04/24/2017) 30 tablet 0 Not Taking at Unknown time  . naproxen (NAPROSYN) 500 MG tablet Take 1 tablet (500 mg total) by mouth 2 (two) times daily with a meal. (Patient not taking: Reported on 04/24/2017) 20 tablet 2 Not Taking at Unknown time    Patient Stressors: Financial difficulties Health problems Substance abuse  Patient Strengths: Average or above average intelligence Motivation for treatment/growth  Treatment Modalities: Medication Management, Group therapy, Case management,  1 to 1 session with clinician, Psychoeducation, Recreational therapy.   Physician Treatment Plan for Primary Diagnosis: Bipolar I disorder, current or most recent episode depressed, with psychotic features (HCC) Long Term Goal(s): Improvement in symptoms so as ready for discharge Improvement in symptoms so as ready for discharge   Short Term Goals: Ability to identify changes in lifestyle to reduce recurrence of condition will improve Ability to verbalize feelings will improve Ability to disclose and discuss suicidal ideas Ability to demonstrate self-control will improve Ability to identify and develop effective coping behaviors will improve Ability to maintain clinical measurements within normal limits will improve Compliance  with prescribed medications will improve Ability to identify triggers associated with substance abuse/mental health issues will improve Ability to identify changes in lifestyle to reduce recurrence of condition will improve Ability to demonstrate self-control will improve Ability to identify triggers associated with substance abuse/mental health issues will improve  Medication Management: Evaluate patient's response, side  effects, and tolerance of medication regimen.  Therapeutic Interventions: 1 to 1 sessions, Unit Group sessions and Medication administration.  Evaluation of Outcomes: Progressing  Physician Treatment Plan for Secondary Diagnosis: Principal Problem:   Bipolar I disorder, current or most recent episode depressed, with psychotic features (HCC) Active Problems:   Acute psychosis (HCC)   Amphetamine use disorder, mild (HCC)  Long Term Goal(s): Improvement in symptoms so as ready for discharge Improvement in symptoms so as ready for discharge   Short Term Goals: Ability to identify changes in lifestyle to reduce recurrence of condition will improve Ability to verbalize feelings will improve Ability to disclose and discuss suicidal ideas Ability to demonstrate self-control will improve Ability to identify and develop effective coping behaviors will improve Ability to maintain clinical measurements within normal limits will improve Compliance with prescribed medications will improve Ability to identify triggers associated with substance abuse/mental health issues will improve Ability to identify changes in lifestyle to reduce recurrence of condition will improve Ability to demonstrate self-control will improve Ability to identify triggers associated with substance abuse/mental health issues will improve     Medication Management: Evaluate patient's response, side effects, and tolerance of medication regimen.  Therapeutic Interventions: 1 to 1 sessions, Unit Group sessions and Medication administration.  Evaluation of Outcomes: Progressing   RN Treatment Plan for Primary Diagnosis: Bipolar I disorder, current or most recent episode depressed, with psychotic features (HCC) Long Term Goal(s): Knowledge of disease and therapeutic regimen to maintain health will improve  Short Term Goals: Ability to participate in decision making will improve, Ability to verbalize feelings will improve,  Ability to identify and develop effective coping behaviors will improve and Compliance with prescribed medications will improve  Medication Management: RN will administer medications as ordered by provider, will assess and evaluate patient's response and provide education to patient for prescribed medication. RN will report any adverse and/or side effects to prescribing provider.  Therapeutic Interventions: 1 on 1 counseling sessions, Psychoeducation, Medication administration, Evaluate responses to treatment, Monitor vital signs and CBGs as ordered, Perform/monitor CIWA, COWS, AIMS and Fall Risk screenings as ordered, Perform wound care treatments as ordered.  Evaluation of Outcomes: Progressing   LCSW Treatment Plan for Primary Diagnosis: Bipolar I disorder, current or most recent episode depressed, with psychotic features (HCC) Long Term Goal(s): Safe transition to appropriate next level of care at discharge, Engage patient in therapeutic group addressing interpersonal concerns.  Short Term Goals: Engage patient in aftercare planning with referrals and resources and Increase skills for wellness and recovery  Therapeutic Interventions: Assess for all discharge needs, 1 to 1 time with Social worker, Explore available resources and support systems, Assess for adequacy in community support network, Educate family and significant other(s) on suicide prevention, Complete Psychosocial Assessment, Interpersonal group therapy.  Evaluation of Outcomes: Progressing   Progress in Treatment: Attending groups: Yes. Participating in groups: Yes. Taking medication as prescribed: Yes. Toleration medication: No. Family/Significant other contact made: Yes, individual(s) contacted:  Pt's mother Verda Cuminsamara Stronberg has been contacted Patient understands diagnosis: Yes. Discussing patient identified problems/goals with staff: Yes. Medical problems stabilized or resolved: Yes. Denies suicidal/homicidal  ideation: Yes. Issues/concerns per patient self-inventory: No. Other:  n/a  New problem(s) identified: No, Describe:  No new issues identified  New Short Term/Long Term Goal(s): Pt's goal is "I want to help take care of my mother and myself.  I just want to be with my family"  Discharge Plan or Barriers: Tentative plan for discharge back to her home with follow-up services to continue with [facility].  Reason for Continuation of Hospitalization: Anxiety Depression Medication stabilization  Estimated Length of Stay: 3-5 days  Recreational Therapy: Patient Stressors: To many choices Patient Goal: Patient will identify 3 new coping skills to use post discharge x5 days.   Attendees: Patient: Vicki Black 04/27/2017 5:22 PM  Physician: Kristine Linea, MD 04/27/2017 5:22 PM  Nursing:  04/27/2017 5:22 PM  RN Care Manager: Bruce Donath, RN 04/27/2017 5:22 PM  Social Worker: Huey Romans, LCSW 04/27/2017 5:22 PM  Recreational Therapist: Garret Reddish, LRT 04/27/2017 5:22 PM  Other:  04/27/2017 5:22 PM  Other:  04/27/2017 5:22 PM  Other: 04/27/2017 5:22 PM    Scribe for Treatment Team: Alease Frame, LCSW 04/27/2017 5:22 PM

## 2017-04-27 NOTE — [facility] Group Notes (Signed)
[facility] Group Notes:  (Nursing/MHT/Case Management/Adjunct)  Date:  04/27/2017  Time:  6:52 AM  Type of Therapy:  Psychoeducational Skills  Participation Level:  Active  Participation Quality:  Appropriate and Attentive  Affect:  Appropriate  Cognitive:  Appropriate  Insight:  Appropriate and Good  Engagement in Group:  Engaged  Modes of Intervention:  Discussion, Socialization and Support  Summary of Progress/Problems:  Vicki MilroyLaquanda Y [name] 04/27/2017, 6:52 AM

## 2017-04-27 NOTE — [facility] Group Notes (Signed)
04/27/2017 9:30AM  Type of Therapy and Topic:  Group Therapy:  Feelings around Relapse and Recovery  Participation Level:  Active   Description of Group:    Patients in this group will discuss emotions they experience before and after a relapse. They will process how experiencing these feelings, or avoidance of experiencing them, relates to having a relapse. Facilitator will guide patients to explore emotions they have related to recovery. Patients will be encouraged to process which emotions are more powerful. They will be guided to discuss the emotional reaction significant others in their lives may have to patients' relapse or recovery. Patients will be assisted in exploring ways to respond to the emotions of others without this contributing to a relapse.  Therapeutic Goals: 1. Patient will identify two or more emotions that lead to a relapse for them 2. Patient will identify two emotions that result when they relapse 3. Patient will identify two emotions related to recovery 4. Patient will demonstrate ability to communicate their needs through discussion and/or role plays   Summary of Patient Progress: Actively and appropriately engaged in the group. Patient was able to provide support and validation to other group members.Patient practiced active listening when interacting with the facilitator and other group members Patient in still in the process of obtaining treatment goals. Wilberta spoke about how taking on too much led to her relapse. She understands now that she needs to be more organized and focused in the future.     Therapeutic Modalities:   Cognitive Behavioral Therapy Solution-Focused Therapy Assertiveness Training Relapse Prevention Therapy   Johny ShearsCassandra  Dustin Bumbaugh, LCSW 04/27/2017 10:25 AM

## 2017-04-28 MED ORDER — LURASIDONE HCL 40 MG PO TABS
40.0000 mg | ORAL_TABLET | Freq: Every day | ORAL | Status: DC
  Administered 2017-04-28: 40 mg via ORAL
  Filled 2017-04-28: qty 1

## 2017-04-28 NOTE — Plan of Care (Signed)
D:Patient  knowledgeable  of  [place] education, Affect pleasant on approach , mood stable . Denies suicidal  Ideations. Attending unit programing  Able to verbalize  Feelings.   Working on Special educational needs teachercoping skills Leaflet given .Marland Kitchen.  No auditory hallucinations  No pain concerns . Appropriate ADL'S. Interacting with peers and staff.  A: Encourage patient participation with unit programming . Instruction  Given on  Medication , verbalize understanding. R: Voice no other concerns. Staff continue to monitor   Progressing Education: Knowledge of [place] [facility] Education information/materials will improve 04/28/2017 1625 - Progressing by Crist InfanteFarrish, Laretta Pyatt A, RN 04/28/2017 1005 - Progressing by Crist InfanteFarrish, Halee Glynn A, RN Emotional status will improve 04/28/2017 1625 - Progressing by Crist InfanteFarrish, Tiny Rietz A, RN 04/28/2017 1005 - Progressing by Crist InfanteFarrish, Margarine Grosshans A, RN Mental status will improve 04/28/2017 1625 - Progressing by Crist InfanteFarrish, Leylah Tarnow A, RN 04/28/2017 1005 - Progressing by Crist InfanteFarrish, Jonette Wassel A, RN Verbalization of understanding the information provided will improve 04/28/2017 1625 - Progressing by Crist InfanteFarrish, Leonore Frankson A, RN 04/28/2017 1005 - Progressing by Crist InfanteFarrish, Azuree Minish A, RN Self-Concept: Ability to disclose and discuss suicidal ideas will improve 04/28/2017 1625 - Progressing by Crist InfanteFarrish, Alyssia Heese A, RN 04/28/2017 1005 - Progressing by Crist InfanteFarrish, Levonte Molina A, RN Ability to verbalize positive feelings about self will improve 04/28/2017 1625 - Progressing by Crist InfanteFarrish, Jamiah Homeyer A, RN 04/28/2017 1005 - Progressing by Crist InfanteFarrish, Keelan Tripodi A, RN Coping: Ability to cope will improve 04/28/2017 1625 - Progressing by Crist InfanteFarrish, Iam Lipson A, RN 04/28/2017 1005 - Progressing by Crist InfanteFarrish, Etrulia Zarr A, RN Ability to verbalize feelings will improve 04/28/2017 1625 - Progressing by Crist InfanteFarrish, Melanny Wire A, RN 04/28/2017 1005 - Progressing by Crist InfanteFarrish, Alphonsine Minium A, RN [facility] Participation in Recreation Therapeutic Interventions STG-Other Recreation Therapy Goal (Specify) Description Patient will  identify 3 new coping skills to use post discharge x5 days.  04/28/2017 1005 - Progressing by Crist InfanteFarrish, Isiaah Cuervo A, RN

## 2017-04-28 NOTE — Progress Notes (Signed)
D:Patient  knowledgeable  of  [place] education, Affect pleasant on approach , mood stable . Denies suicidal  Ideations. Attending unit programing  Able to verbalize  Feelings.   Working on coping skills Leaflet given ..  No auditory hallucinations  No pain concerns . Appropriate ADL'S. Interacting with peers and staff.  A: Encourage patient participation with unit programming . Instruction  Given on  Medication , verbalize understanding. R: Voice no other concerns. Staff continue to monitor  

## 2017-04-28 NOTE — [facility] Group Notes (Signed)
LCSW Group Therapy Note   04/28/2017 1:15pm   Type of Therapy and Topic:  Group Therapy:  Trust and Honesty  Participation Level:  Active  Description of Group:    In this group patients will be asked to explore the value of being honest.  Patients will be guided to discuss their thoughts, feelings, and behaviors related to honesty and trusting in others. Patients will process together how trust and honesty relate to forming relationships with peers, family members, and self. Each patient will be challenged to identify and express feelings of being vulnerable. Patients will discuss reasons why people are dishonest and identify alternative outcomes if one was truthful (to self or others). This group will be process-oriented, with patients participating in exploration of their own experiences, giving and receiving support, and processing challenge from other group members.   Therapeutic Goals: 1. Patient will identify why honesty is important to relationships and how honesty overall affects relationships.  2. Patient will identify a situation where they lied or were lied too and the  feelings, thought process, and behaviors surrounding the situation 3. Patient will identify the meaning of being vulnerable, how that feels, and how that correlates to being honest with self and others. 4. Patient will identify situations where they could have told the truth, but instead lied and explain reasons of dishonesty.   Summary of Patient Progress: Pt identified why honesty is important to relationships and how honesty overall affects relationships. She reports she feels better today. She shared with the group a situation where he could have told the truth  But instead lie and explained reasons of dishonesty. Pt fully engaged in group discussion.       Therapeutic Modalities:   Cognitive Behavioral Therapy Solution Focused Therapy Motivational Interviewing Brief Therapy  Jeffrey Voth  CUEBAS-COLON,  LCSW 04/28/2017 1:00 PM

## 2017-04-28 NOTE — Plan of Care (Signed)
D:Patient  knowledgeable  of  [place] education, Affect pleasant on approach , mood stable . Denies suicidal  Ideations. Attending unit programing  Able to verbalize  Feelings.   Working on Special educational needs teachercoping skills Leaflet given .Marland Kitchen.  No auditory hallucinations  No pain concerns . Appropriate ADL'S. Interacting with peers and staff.  A: Encourage patient participation with unit programming . Instruction  Given on  Medication , verbalize understanding. R: Voice no other concerns. Staff continue to monitor

## 2017-04-28 NOTE — Progress Notes (Addendum)
[facility]  04/28/2017 1:20 PM Vicki NeighborsCandice N Black  MRN:  [id number]  Subjective: The patient was folding clothes when I walked into her room to talk with her. Endorses that she will go to live with her mother when she gets discharged. During the interview was slightly tangential when she started talking about her mother and her grandmother. When I asked patient about her legal guardian is, she said it would have to be her mother since she is in the hospital and says her mother has been dealing with cancer and is not well. Says if things don't work out with her mother, she will go to a shelter. Patient denies any suicidal thoughts, thoughts about dying or safety issues. When I asked her about amphetamine use, she says she has no idea how her urine is positive for those  Treatment plan. Continue Latuda 40 mg daily.  Social/dipsoition. Has family here including children but has been transient/homeless.  Principal Problem: Bipolar I disorder, current or most recent episode depressed, with psychotic features [facility](HCC) Diagnosis:   Patient Active Problem List   Diagnosis Date Noted  . Bipolar I disorder, current or most recent episode depressed, with psychotic features (HCC) [F31.5] 04/25/2017  . Acute psychosis (HCC) [F23] 04/24/2017  . 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 Severe recurrent major depression without psychotic features (HCC) [F33.2] 01/05/2016  . Cannabis abuse [F12.10] 01/05/2016  . 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  Past Medical History:  Past Medical History:  Diagnosis Date  . Anxiety   . Back pain   . Bursitis of hip    trochanteric bursitis of both hips    Past Surgical History:  Procedure Laterality Date  . DENTAL SURGERY    . TUBAL LIGATION     Family History:  History reviewed. No pertinent family history. Family Psychiatric  History: depression. Social History:  Social History   Substance and Sexual Activity  Alcohol Use Yes   Comment: socially     Social History   Substance and Sexual Activity  Drug Use Yes  . Types: Marijuana    Social History   Socioeconomic History  . Marital status: Single    Spouse name: None  . Number of children: None  . Years of education: None  . Highest education level: None  Social Needs  . Financial resource strain: None  . Food insecurity - worry: None  . Food insecurity - inability: None  . Transportation needs - medical: None  . Transportation needs - non-medical: None  Occupational History  . None  Tobacco Use  . Smoking status: Former Games developermoker  . Smokeless tobacco: Never Used  Substance and Sexual Activity  . Alcohol use: Yes    Comment: socially  . Drug use: Yes    Types: Marijuana  . Sexual activity: Yes    Birth control/protection: Surgical  Other Topics Concern  . None  Social History Narrative  . None   Additional Social History:                         Sleep: Fair  Appetite:  Fair  Current Medications: Current Facility-Administered Medications  Medication Dose Route Frequency Provider Last Rate Last Dose  . acetaminophen (TYLENOL) tablet 650 mg  650 mg Oral Q6H PRN Clapacs, John T,  MD      . alum & mag hydroxide-simeth (MAALOX/MYLANTA) 200-200-20 MG/5ML suspension 30 mL  30 mL Oral Q4H PRN Clapacs, John T, MD      . hydrOXYzine (ATARAX/VISTARIL) tablet 25 mg  25 mg Oral TID PRN Clapacs, Jackquline Denmark, MD   25 mg at 04/25/17 2146  . lurasidone (LATUDA) tablet 80 mg  80 mg Oral Q supper Pucilowska, Jolanta B, MD   80 mg at 04/27/17 1721  . magnesium hydroxide (MILK OF MAGNESIA) suspension 30 mL  30 mL Oral Daily PRN Clapacs, John T, MD      . traZODone (DESYREL) tablet 100 mg  100 mg Oral QHS PRN Clapacs, Jackquline Denmark, MD        Lab Results: No results found for this or any  previous visit (from the past 48 hour(s)).  Blood Alcohol level:  Lab Results  Component Value Date   ETH <10 04/23/2017   ETH <5 01/04/2016    Metabolic Disorder Labs: Lab Results  Component Value Date   HGBA1C 5.4 04/25/2017   MPG 108.28 04/25/2017   No results found for: PROLACTIN Lab Results  Component Value Date   CHOL 167 04/25/2017   TRIG 67 04/25/2017   HDL 46 04/25/2017   CHOLHDL 3.6 04/25/2017   VLDL 13 04/25/2017   LDLCALC 108 (H) 04/25/2017    Physical Findings: AIMS:  , ,  ,  ,    CIWA:    COWS:     Musculoskeletal: Strength & Muscle Tone: within normal limits Gait & Station: normal Patient leans: N/A  Psychiatric Specialty Exam: Physical Exam  Nursing note and vitals reviewed. Psychiatric: Her speech is normal. Her affect is labile and inappropriate. She is slowed. Cognition and memory are impaired. She expresses impulsivity.    Review of Systems  Neurological: Negative.   Psychiatric/Behavioral: Positive for substance abuse.  All other systems reviewed and are negative.   Blood pressure 104/66, pulse 87, temperature 98.1 F (36.7 C), temperature source Oral, resp. rate 16, height 5\' 3"  (1.6 m), weight 110 lb (49.9 kg), SpO2 98 %.Body mass index is 19.49 kg/m.  General Appearance: Casual  Eye Contact:  Good  Speech:  Slow  Volume:  Decreased  Mood:  Irritable  Affect:  Congruent  Thought Process:  Disorganized and Descriptions of Associations: Tangential  Orientation:  Full (Time, Place, and Person)  Thought Content:  Illogical  Suicidal Thoughts:  No  Homicidal Thoughts:  No  Memory:  Immediate;   Poor Recent;   Poor Remote;   Poor  Judgement:  Poor  Insight:  Lacking  Psychomotor Activity:  Normal  Concentration:  Concentration: Poor and Attention Span: Poor  Recall:  Poor  Fund of Knowledge:  Poor  Language:  Poor  Akathisia:  No  Handed:  Right  AIMS (if indicated):     Assets:  Communication Skills Desire for  Improvement Physical Health Resilience  ADL's:  Intact  Cognition:  WNL  Sleep:  Number of Hours: 8.15     Treatment Plan Summary: Daily contact with patient to assess and evaluate symptoms and progress in treatment and Medication management   Ms. Mood is a 27 year old female with a history of depression admitted for psychotic break. Positive for amphetamines. Very disorganized.  #Suicidal ideation -patient able to contract for safety  #Mood and psychosis -discontinue Effexor 75 mg daily, patient refuses -continue Latuda 40 mg with supper  -Trazodone 100 mg nightly  #Metabolic syndrome monitoring -Lipid panel, TSH and  HgbA1C are normal -EKG -pregnancy test is negative  #Substance abuse -positive for amphetamines -denies substance use  #Disposition -discharge with family -follow up with [facility]    Aundria Rud, MD 04/28/2017, 1:20 PM

## 2017-04-28 NOTE — Plan of Care (Signed)
Patient is oriented to unit, patient's safety is maintained. Patient denies SI/HI/AVH.    Progressing Education: Knowledge of [facility] Education information/materials will improve 04/28/2017 2045 - Progressing by Berkley Harveyeynolds, Mose Colaizzi I, RN Self-Concept: Ability to disclose and discuss suicidal ideas will improve 04/28/2017 2045 - Progressing by Berkley Harveyeynolds, Torianna Junio I, RN

## 2017-04-29 MED ORDER — BUSPIRONE HCL 10 MG PO TABS
10.0000 mg | ORAL_TABLET | Freq: Three times a day (TID) | ORAL | Status: DC | PRN
  Administered 2017-04-29 (×2): 10 mg via ORAL
  Filled 2017-04-29 (×3): qty 1

## 2017-04-29 MED ORDER — LURASIDONE HCL 40 MG PO TABS
60.0000 mg | ORAL_TABLET | Freq: Every day | ORAL | Status: DC
  Administered 2017-04-29: 60 mg via ORAL
  Filled 2017-04-29: qty 1

## 2017-04-29 NOTE — Plan of Care (Signed)
  Education: Knowledge of [facility] Education information/materials will improve 04/29/2017 1027 - Progressing by Elige Radonobb, Tekla Malachowski B, RN Emotional status will improve 04/29/2017 1027 - Progressing by Elige Radonobb, Joetta Delprado B, RN Note Pleasant and cooperative.  Denies SI/HI/AVH.  Denies depression or any anxiety.   Mental status will improve 04/29/2017 1027 - Progressing by Elige Radonobb, Wirt Hemmerich B, RN Note Verbalizes that she is doing much better.  Preoccupied with family history. Verbalizes that she does not know who her biological father is and feels that she needs to know her medical pre-disposition.   Verbalization of understanding the information provided will improve 04/29/2017 1027 - Progressing by Elige Radonobb, Taliah Porche B, RN   Education: Mental status will improve 04/29/2017 1027 - Progressing by Elige Radonobb, Luddie Boghosian B, RN Note Verbalizes that she is doing much better.  Preoccupied with family history. Verbalizes that she does not know who her biological father is and feels that she needs to know her medical pre-disposition.     Education: Verbalization of understanding the information provided will improve 04/29/2017 1027 - Progressing by Elige Radonobb, Sena Hoopingarner B, RN   Self-Concept: Ability to disclose and discuss suicidal ideas will improve 04/29/2017 1027 - Progressing by Elige Radonobb, Klani Caridi B, RN Note Denies SI or any thoughts of self harm.   Ability to verbalize positive feelings about self will improve 04/29/2017 1027 - Progressing by Elige Radonobb, Leocadia Idleman B, RN   Coping: Ability to cope will improve 04/29/2017 1027 - Progressing by Elige Radonobb, Maddyn Lieurance B, RN Note Verbalizes feelings to help her cope.   Ability to verbalize feelings will improve 04/29/2017 1027 - Progressing by Elige Radonobb, Richard Holz B, RN Note Verbalizes feelings and thoughts without any difficulty.

## 2017-04-29 NOTE — [facility] Group Notes (Addendum)
[facility] Group Notes:  (Nursing/MHT/Case Management/Adjunct)  Date:  04/28/2017  Time:  8:47 PM  Type of Therapy:  Psychoeducational Skills  Participation Level:  Active  Participation Quality:  Appropriate and Attentive  Affect:  Appropriate  Cognitive:  Appropriate  Insight:  Appropriate and Good  Engagement in Group:  Engaged  Modes of Intervention:  Discussion, Socialization and Support  Summary of Progress/Problems:  Chancy MilroyLaquanda Y Kashayla Ungerer 04/28/2017, 8:47 PM

## 2017-04-29 NOTE — Progress Notes (Signed)

## 2017-04-29 NOTE — Progress Notes (Signed)
Patient presented to the nurses requesting something for anxiety.  When given vistaril, patient refused stating that vistaril gives her headaches. Further states that she suffers from panic attacks and that she needs something to help with muscle tension.  Dr. Teryl Lucyakeesh notified and orders received.

## 2017-04-29 NOTE — Progress Notes (Addendum)
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 Says her anxiety has not been addressed adequately and she would not like hydroxyzine to be given anymore. It makes her feel sleepy. Says she speaks to both her moms, one of her mom's as a grandmother. She would go to her grandmother's house after discharge, says it's almost the same thing. Yesterday patient came up to me and said she could not tolerate 80 mg of lurasidone because it was making her feel really sleepy. Endorses that she would not like medications which make her feel sleepy as he would prevent her from taking up an active job. Treatment plan. Plan to continue lurasidone 60 mg daily.  Social/dipsoition. Has family here including children but has been transient/homeless.  Principal Problem: Bipolar I disorder, current or most recent episode depressed, with psychotic features [facility]) Diagnosis:   Patient Active Problem List   Diagnosis Date Noted  . Bipolar I disorder, current or most recent episode depressed, with psychotic features (HCC) [F31.5] 04/25/2017  . Acute psychosis (HCC) [F23] 04/24/2017  . 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 Severe recurrent major depression without psychotic features (HCC) [F33.2] 01/05/2016  . Cannabis abuse [F12.10] 01/05/2016  . 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  Past Medical History:  Past Medical History:  Diagnosis Date  . Anxiety   . Back pain   . Bursitis of hip    trochanteric bursitis of both hips    Past Surgical History:  Procedure Laterality Date  . DENTAL SURGERY    . TUBAL LIGATION     Family History: History reviewed. No pertinent family history. Family Psychiatric   History: depression. Social History:  Social History   Substance and Sexual Activity  Alcohol Use Yes   Comment: socially     Social History   Substance and Sexual Activity  Drug Use Yes  . Types: Marijuana    Social History   Socioeconomic History  . Marital status: Single    Spouse name: None  . Number of children: None  . Years of education: None  . Highest education level: None  Social Needs  . Financial resource strain: None  . Food insecurity - worry: None  . Food insecurity - inability: None  . Transportation needs - medical: None  . Transportation needs - non-medical: None  Occupational History  . None  Tobacco Use  . Smoking status: Former Games developer  . Smokeless tobacco: Never Used  Substance and Sexual Activity  . Alcohol use: Yes    Comment: socially  . Drug use: Yes    Types: Marijuana  . Sexual activity: Yes    Birth control/protection: Surgical  Other Topics Concern  . None  Social History Narrative  . None   Additional Social History:                         Sleep: Fair  Appetite:  Fair  Current Medications: Current Facility-Administered Medications  Medication Dose Route Frequency Provider Last Rate Last Dose  . acetaminophen (TYLENOL) tablet 650 mg  650 mg Oral Q6H PRN Clapacs, Jackquline Denmark, MD   650 mg at 04/29/17 4098  . alum & mag hydroxide-simeth (MAALOX/MYLANTA) 200-200-20 MG/5ML suspension 30  mL  30 mL Oral Q4H PRN Clapacs, John T, MD      . busPIRone (BUSPAR) tablet 10 mg  10 mg Oral TID PRN Aundria Rud, MD   10 mg at 04/29/17 1258  . lurasidone (LATUDA) tablet 60 mg  60 mg Oral QHS Aundria Rud, MD      . magnesium hydroxide (MILK OF MAGNESIA) suspension 30 mL  30 mL Oral Daily PRN Clapacs, John T, MD      . traZODone (DESYREL) tablet 100 mg  100 mg Oral QHS PRN Clapacs, Jackquline Denmark, MD        Lab Results: No results found for this or any previous visit (from the past 48 hour(s)).  Blood Alcohol level:  Lab Results   Component Value Date   ETH <10 04/23/2017   ETH <5 01/04/2016    Metabolic Disorder Labs: Lab Results  Component Value Date   HGBA1C 5.4 04/25/2017   MPG 108.28 04/25/2017   No results found for: PROLACTIN Lab Results  Component Value Date   CHOL 167 04/25/2017   TRIG 67 04/25/2017   HDL 46 04/25/2017   CHOLHDL 3.6 04/25/2017   VLDL 13 04/25/2017   LDLCALC 108 (H) 04/25/2017    Physical Findings: AIMS:  , ,  ,  ,    CIWA:    COWS:     Musculoskeletal: Strength & Muscle Tone: within normal limits Gait & Station: normal Patient leans: N/A  Psychiatric Specialty Exam: Physical Exam  Nursing note and vitals reviewed. Psychiatric: Her speech is normal. Her affect is labile and inappropriate. She is slowed. Cognition and memory are impaired. She expresses impulsivity.    Review of Systems  Neurological: Negative.   Psychiatric/Behavioral: Positive for substance abuse.  All other systems reviewed and are negative.   Blood pressure 100/65, pulse 78, temperature 98.2 F (36.8 C), temperature source Oral, resp. rate 18, height 5\' 3"  (1.6 m), weight 110 lb (49.9 kg), SpO2 98 %.Body mass index is 19.49 kg/m.  General Appearance: Casual  Eye Contact:  Good  Speech:  Slow  Volume:  Decreased  Mood:  Irritable  Affect:  Congruent  Thought Process:  Disorganized and Descriptions of Associations: Tangential  Orientation:  Full (Time, Place, and Person)  Thought Content:  Illogical  Suicidal Thoughts:  No  Homicidal Thoughts:  No  Memory:  Immediate;   Poor Recent;   Poor Remote;   Poor  Judgement:  Poor  Insight:  Lacking  Psychomotor Activity:  Normal  Concentration:  Concentration: Poor and Attention Span: Poor  Recall:  Poor  Fund of Knowledge:  Poor  Language:  Poor  Akathisia:  No  Handed:  Right  AIMS (if indicated):     Assets:  Communication Skills Desire for Improvement Physical Health Resilience  ADL's:  Intact  Cognition:  WNL  Sleep:  Number of  Hours: 8.15     Treatment Plan Summary: Daily contact with patient to assess and evaluate symptoms and progress in treatment and Medication management   [name] is a 27 year old female with a history of depression admitted for psychotic break. Positive for amphetamines. Patient continues to be psychotic, thought process tangential. Has poor insight and compliance of medication is questionable.  #Suicidal ideation -patient able to contract for safety  #Mood and psychosis -discontinue Effexor 75 mg daily, patient refuses -Decreased dose of lurasidone to 60 mg daily. Yesterday patient was given 40 mg of lurasidone as she said she could not tolerate 80 mg. However  since she has been on 80 mg, it would be prudent to lower the dose to 60 mg and try and see this dose is optimal for her. -Trazodone 100 mg nightly  #Anxiety symptoms - Buspirone 10 mg 3 times daily as needed  #Metabolic syndrome monitoring -Lipid panel, TSH and HgbA1C are normal -EKG - QTC 447 ms -pregnancy test is negative  #Substance abuse -positive for amphetamines -denies substance use  #Disposition -discharge with family -follow up with [facility]    Aundria RudGopalkumar Kelsi Benham, MD 04/29/2017, 1:44 PM

## 2017-04-29 NOTE — [facility] Group Notes (Signed)
[facility] Group Notes:  (Nursing/MHT/Case Management/Adjunct)  Date:  04/29/2017  Time:  11:31 PM  Type of Therapy:  Group Therapy  Participation Level:  Active  Participation Quality:  Appropriate  Affect:  Appropriate  Cognitive:  Appropriate  Insight:  Appropriate  Engagement in Group:  Engaged  Modes of Intervention:  Discussion  Summary of Progress/Problems:  Vicki Black 04/29/2017, 11:31 PM

## 2017-04-29 NOTE — [facility] Group Notes (Signed)
LCSW Group Therapy Note 04/29/2017 1:15pm  Type of Therapy and Topic: Group Therapy: Feelings Around Returning Home & Establishing a Supportive Framework and Supporting Oneself When Supports Not Available  Participation Level: None  Description of Group:  Patients first processed thoughts and feelings about upcoming discharge. These included fears of upcoming changes, lack of change, new living environments, judgements and expectations from others and overall stigma of mental health issues. The group then discussed the definition of a supportive framework, what that looks and feels like, and how do to discern it from an unhealthy non-supportive network. The group identified different types of supports as well as what to do when your family/friends are less than helpful or unavailable  Therapeutic Goals  1. Patient will identify one healthy supportive network that they can use at discharge. 2. Patient will identify one factor of a supportive framework and how to tell it from an unhealthy network. 3. Patient able to identify one coping skill to use when they do not have positive supports from others. 4. Patient will demonstrate ability to communicate their needs through discussion and/or role plays.  Summary of Patient Progress: Pt attended group but did not participate.    Therapeutic Modalities Cognitive Behavioral Therapy Motivational Interviewing   Lovelace Cerveny  CUEBAS-COLON, LCSW 04/29/2017 1:07 PM

## 2017-04-30 MED ORDER — LURASIDONE HCL 40 MG PO TABS: 40.0000 mg | ORAL_TABLET | Freq: Every day | ORAL | Status: DC

## 2017-04-30 MED ORDER — LATUDA 40 MG PO TABS: 40.0000 mg | ORAL_TABLET | Freq: Every day | ORAL | 1 refills | Status: DC

## 2017-04-30 MED ORDER — BUSPIRONE HCL 10 MG PO TABS: 10.0000 mg | ORAL_TABLET | Freq: Three times a day (TID) | ORAL | 1 refills | Status: DC | PRN

## 2017-04-30 MED ORDER — TRAZODONE HCL 100 MG PO TABS: 100.0000 mg | ORAL_TABLET | Freq: Every evening | ORAL | 1 refills | Status: DC | PRN

## 2017-04-30 NOTE — [facility] Suicide Risk Assessment (Signed)
[facility]   Principal Problem: Bipolar I disorder, current or most recent episode depressed, with psychotic features [facility](HCC) Discharge Diagnoses:  Patient Active Problem List   Diagnosis Date Noted  . Bipolar I disorder, current or most recent episode depressed, with psychotic features (HCC) [F31.5] 04/25/2017    Priority: High  . Acute psychosis (HCC) [F23] 04/24/2017  . 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 Severe recurrent major depression without psychotic features (HCC) [F33.2] 01/05/2016  . Cannabis abuse [F12.10] 01/05/2016  . 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   Psychiatric/Behavioral: Positive for substance abuse.  All other systems reviewed and are negative.   Blood pressure (!) 109/40, pulse (!) 108, temperature 97.9 F (36.6 C), temperature source Oral, resp. rate 16, height 5\' 3"  (1.6 m), weight 49.9 kg (110 lb), SpO2 98 %.Body mass index is 19.49 kg/m.  General Appearance: Casual  Eye Contact::  Good  Speech:  Clear and Coherent409  Volume:  Normal  Mood:  Anxious  Affect:  Appropriate  Thought Process:  Goal Directed and Descriptions of Associations: Intact  Orientation:  Full (Time, Place, and Person)  Thought Content:  WDL  Suicidal Thoughts:  No  Homicidal Thoughts:  No  Memory:  Immediate;   Fair Recent;   Fair Remote;   Fair  Judgement:  Poor  Insight:  Lacking  Psychomotor Activity:  Normal  Concentration:  Fair  Recall:  FiservFair  Fund of Knowledge:Fair  Language: Fair  Akathisia:  No  Handed:  Right  AIMS (if indicated):     Assets:  Communication Skills Desire for Improvement Financial  Resources/Insurance Physical Health Resilience Social Support  Sleep:  Number of Hours: 6  Cognition: WNL  ADL's:  Intact   Mental Status Per Nursing Assessment::   On Admission:     Demographic Factors:  Caucasian, Low socioeconomic status and Unemployed  Loss Factors: Financial problems/change in socioeconomic status  Historical Factors: Prior suicide attempts, Family history of mental illness or substance abuse and Impulsivity  Risk Reduction Factors:   Responsible for children under 28 years of age, Sense of responsibility to family and Positive social support  Continued Clinical Symptoms:  Bipolar Disorder:   Depressive phase Alcohol/Substance Abuse/Dependencies  Cognitive Features That Contribute To Risk:  None    Suicide Risk:  Minimal: No identifiable suicidal ideation.  Patients presenting with no risk factors but with morbid ruminations; may be classified as minimal risk based on the severity of the depressive symptoms    Plan Of Care/Follow-up recommendations:  Activity:  as gtolerated Diet:  regular Other:  keep follow up appointments  Kristine LineaJolanta Jeslin Bazinet, MD 04/30/2017, 8:56 AM

## 2017-04-30 NOTE — Progress Notes (Signed)
D: Patient is aware of  Discharge this shift .Patient denies suicidal /homicidal ideations. Patient received all belongings brought in  A: No Storage medications. Writer reviewed Discharge Summary, Suicide Risk Assessment, and Transitional Record. Patient also received Prescriptions   from  MD.. Aware  Of follow up appointment . R: Patient left unit with no questions  Or concerns  With family 
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
## 2017-04-30 NOTE — Progress Notes (Signed)
Recreation Therapy Notes  INPATIENT RECREATION TR PLAN  Patient Details Name: Vicki Black MRN: [id number] DOB: 1990-05-21 Today's Date: 04/30/2017  Rec Therapy Plan Is patient appropriate for Therapeutic Recreation?: Yes Treatment times per week: at least 3  Estimated Length of Stay: 5-7 days TR Treatment/Interventions: Group participation (Comment)  Discharge Criteria Pt will be discharged from therapy if:: Discharged Treatment plan/goals/alternatives discussed and agreed upon by:: Patient/family  Discharge Summary Short term goals set: Patient will identify 3 new coping skills to use post discharge x5 days.  Short term goals met: Adequate for discharge Progress toward goals comments: Groups attended Which groups?: Other (Comment)(Time management, Problem Solving) Reason goals not met: N/A Therapeutic equipment acquired: N/A Reason patient discharged from therapy: Discharge from hospital Pt/family agrees with progress & goals achieved: Yes Date patient discharged from therapy: 04/30/17   Delmont Prosch 04/30/2017, 3:11 PM

## 2017-04-30 NOTE — Progress Notes (Signed)
Patient was cooperative with treatment and she was visible in the milieu interacting with peers and staff, she still reports anxiety and given PRN's for anxiety and sleep with effect. She is currently in bed resting quietly at this time.

## 2017-04-30 NOTE — BHH Group Notes (Signed)
04/30/2017  Time: 0930   Type of Therapy and Topic:  Group Therapy:  Overcoming Obstacles   Participation Level:  Minimal   Description of Group:   In this group patients will be encouraged to explore what they see as obstacles to their own wellness and recovery. They will be guided to discuss their thoughts, feelings, and behaviors related to these obstacles. The group will process together ways to cope with barriers, with attention given to specific choices patients can make. Each patient will be challenged to identify changes they are motivated to make in order to overcome their obstacles. This group will be process-oriented, with patients participating in exploration of their own experiences, giving and receiving support, and processing challenge from other group members.   Therapeutic Goals: 1. Patient will identify personal and current obstacles as they relate to admission. 2. Patient will identify barriers that currently interfere with their wellness or overcoming obstacles.  3. Patient will identify feelings, thought process and behaviors related to these barriers. 4. Patient will identify two changes they are willing to make to overcome these obstacles:      Summary of Patient Progress Pt attended group but only stayed for the group check in. Pt reported she is feeling better because she is going home today. Pt identified the obstacle that brought her into the hospital as, "a little bit of everything." Pt then asked to be excused so she could take a shower before she is discharged. Pt did not return to group.    Therapeutic Modalities:   Cognitive Behavioral Therapy Solution Focused Therapy Motivational Interviewing Relapse Prevention Therapy  Heidi DachKelsey Doneisha Ivey, MSW, LCSW 04/30/2017 10:23 AM

## 2017-04-30 NOTE — Progress Notes (Signed)
  [facility] :  Will you be returning to the same living situation after discharge:  Yes,    At discharge, do you have transportation home?: Yes,    Do you have the ability to pay for your medications: Yes,     Release of information consent forms completed and in the chart;  Patient's signature needed at discharge.  Patient to Follow up at: Follow-up Information    [facility] Go on 05/07/2017.   Why:  Meet at 7:15am for Hospital Follow up for Peer Services with [facility] Peer Specialist Unk PintoHarvey Bryant. Contact information: [address] [place] [id number] [phone]           Next level of care provider [facility]:no  Safety Planning and Suicide Prevention discussed: Yes,     Have you used any form of tobacco in the last 30 days? (Cigarettes, Smokeless Tobacco, Cigars, and/or Pipes): No  Has patient been referred to the Quitline?: N/A patient is not a smoker  Patient has been referred for addiction treatment:YES  Glennon MacSara P Georgeann Brinkman, LCSW 04/30/2017, 9:03 AM

## 2017-05-03 ENCOUNTER — Ambulatory Visit: Payer: Self-pay | Admitting: Certified Nurse Midwife

## 2017-05-09 ENCOUNTER — Ambulatory Visit: Payer: Self-pay | Admitting: Certified Nurse Midwife

## 2018-08-04 ENCOUNTER — Other Ambulatory Visit: Payer: Self-pay

## 2018-08-04 ENCOUNTER — Emergency Department
Admission: EM | Admit: 2018-08-04 | Discharge: 2018-08-04 | Disposition: A | Discharge status: Home / Self Care | Payer: Medicaid Other | Attending: Emergency Medicine | Admitting: Emergency Medicine

## 2018-08-04 ENCOUNTER — Encounter: Payer: Self-pay | Admitting: Emergency Medicine

## 2018-08-04 DIAGNOSIS — Z87891 Personal history of nicotine dependence: Secondary | ICD-10-CM | POA: Diagnosis not present

## 2018-08-04 DIAGNOSIS — Z79899 Other long term (current) drug therapy: Secondary | ICD-10-CM | POA: Insufficient documentation

## 2018-08-04 DIAGNOSIS — L0231 Cutaneous abscess of buttock: Secondary | ICD-10-CM

## 2018-08-04 MED ORDER — LIDOCAINE-EPINEPHRINE (PF) 2 %-1:200000 IJ SOLN
10.0000 mL | Freq: Once | INTRAMUSCULAR | Status: AC
  Administered 2018-08-04: 10 mL

## 2018-08-04 MED ORDER — CLINDAMYCIN HCL 300 MG PO CAPS: 300.0000 mg | ORAL_CAPSULE | Freq: Three times a day (TID) | ORAL | 0 refills | Status: AC

## 2018-08-04 MED ORDER — OXYCODONE HCL 5 MG PO TABS
5.0000 mg | ORAL_TABLET | Freq: Once | ORAL | Status: AC
  Administered 2018-08-04: 5 mg via ORAL
  Filled 2018-08-04: qty 1

## 2018-08-04 MED ORDER — LIDOCAINE-EPINEPHRINE 2 %-1:100000 IJ SOLN: 20.0000 mL | Freq: Once | INTRAMUSCULAR | Status: DC

## 2018-08-04 MED ORDER — OXYCODONE-ACETAMINOPHEN 5-325 MG PO TABS: 1.0000 | ORAL_TABLET | Freq: Four times a day (QID) | ORAL | 0 refills | Status: DC | PRN

## 2018-08-04 NOTE — ED Notes (Signed)
Pt provided with our mesh underwear and pads for drainage.

## 2018-08-04 NOTE — ED Triage Notes (Signed)
PT arrives with concerns over abscess to her right buttocks and one to under there chip to the right. Pt states she was seen at [facility] and wanted her to come to the ED for antibiotics.

## 2018-08-04 NOTE — ED Provider Notes (Signed)
[facility] Emergency Department Provider Note  ____________________________________________  Time seen: Approximately 3:57 PM  I have reviewed the triage vital signs and the nursing notes.   HISTORY  Chief Complaint Abscess   HPI Vicki Black is a 28 y.o. female who presents to the emergency department for treatment and evaluation of abscess on her right buttock. Symptoms started several days ago and have progressively worsened. No relief with Boil Ease.  Past Medical History:  Diagnosis Date  . Anxiety   . Back pain   . Bursitis of hip    trochanteric bursitis of both hips    Patient Active Problem List   Diagnosis Date Noted  . Bipolar I disorder, current or most recent episode depressed, with psychotic features (HCC) 04/25/2017  . Acute psychosis (HCC) 04/24/2017  . Amphetamine use disorder, mild (HCC) 04/24/2017  . Brief reactive psychosis (HCC) 04/24/2017  . Severe recurrent major depression without psychotic features (HCC) 01/05/2016  . Cannabis abuse 01/05/2016  . 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 DENTAL SURGERY    . TUBAL LIGATION      Prior to Admission medications   Medication Sig Start Date End Date Taking? Authorizing Provider  busPIRone (BUSPAR) 10 MG tablet Take 1 tablet (10 mg total) by mouth 3 (three) times daily as needed (anxiety). 04/30/17   Pucilowska, Braulio Conte B, MD  clindamycin (CLEOCIN) 300 MG capsule Take 1 capsule (300 mg total) by mouth 3 (three) times daily for 10 days. 08/04/18 08/14/18  Laronn Devonshire B, FNP  LATUDA 40 MG TABS tablet Take 1 tablet (40 mg total) by mouth at bedtime. 04/30/17   Pucilowska, Braulio Conte B, MD  oxyCODONE-acetaminophen (PERCOCET) 5-325 MG tablet Take 1 tablet by mouth every 6 (six) hours as needed for severe pain. 08/04/18 08/04/19  Karrisa Didio, Rulon Eisenmenger B, FNP  traZODone (DESYREL) 100 MG tablet Take 1 tablet (100 mg total) by  mouth at bedtime as needed for sleep. 04/30/17   Pucilowska, Ellin Goodie, MD    Allergies Sulfa antibiotics and Vicodin [hydrocodone-acetaminophen]  No family history on file.  Social History Social History   Tobacco Use  . Smoking status: Former Games developer  . Smokeless tobacco: Never Used  Substance Use Topics  . Alcohol use: Yes    Comment: socially  . Drug use: Yes    Types: Marijuana    Review of Systems  Constitutional: Negative for fever. Respiratory: Negative for cough or shortness of breath.  Musculoskeletal: Negative for myalgias Skin: Positive for abscess. Neurological: Negative for numbness or paresthesias. ____________________________________________   PHYSICAL EXAM:  VITAL SIGNS: ED Triage Vitals  Enc Vitals Group     BP 08/04/18 1418 106/67     Pulse Rate 08/04/18 1418 (!) 130     Resp 08/04/18 1418 16     Temp 08/04/18 1418 98.2 F (36.8 C)     Temp Source 08/04/18 1418 Oral     SpO2 08/04/18 1418 99 %     Weight 08/04/18 1419 132 lb (59.9 kg)     Height 08/04/18 1419 5\' 4"  (1.626 m)     Head Circumference --      Peak Flow --      Pain Score 08/04/18 1419 8     Pain Loc --      Pain Edu? --      Excl. in GC? --      Constitutional: Well appearing. Eyes: Conjunctivae are clear  without discharge or drainage. Nose: No rhinorrhea noted. Mouth/Throat: Airway is patent.  Neck: No stridor. Unrestricted range of motion observed. Cardiovascular: Capillary refill is <3 seconds.  Respiratory: Respirations are even and unlabored.. Musculoskeletal: Unrestricted range of motion observed. Neurologic: Awake, alert, and oriented x 4.  Skin:  Large fluctuant area with surrounding induration and erythema on right buttock.   ____________________________________________   LABS (all labs ordered are listed, but only abnormal results are displayed)  Labs Reviewed - No data to display ____________________________________________  EKG  Not  indicated. ____________________________________________  RADIOLOGY  Not indicated. ____________________________________________   PROCEDURES  .Marland Kitchen.Incision and Drainage Date/Time: 08/04/2018 4:02 PM Performed by: Chinita Pesterriplett, Kanija Remmel B, FNP Authorized by: Chinita Pesterriplett, Angelika Jerrett B, FNP   Consent:    Consent obtained:  Verbal   Consent given by:  Patient Location:    Type:  Abscess   Location: right buttock. Pre-procedure details:    Skin preparation:  Betadine Anesthesia (see MAR for exact dosages):    Anesthesia method:  Local infiltration   Local anesthetic:  Lidocaine 2% WITH epi Procedure type:    Complexity:  Complex Procedure details:    Incision types:  Stab incision   Incision depth:  Dermal   Scalpel blade:  11   Wound management:  Probed and deloculated   Drainage:  Bloody and purulent   Drainage amount:  Copious   Wound treatment:  Drain placed   Packing materials:  1/4 in iodoform gauze Post-procedure details:    Patient tolerance of procedure:  Tolerated well, no immediate complications   ____________________________________________   INITIAL IMPRESSION / ASSESSMENT AND PLAN / ED COURSE  Vicki Black is a 28 y.o. female presents for treatment of abscess. Incision and drainage performed. She will return in 2 days for wound check and packing removal or sooner for symptoms that worsen. Clindamycin and Percocet prescribed.   Medications  oxyCODONE (Oxy IR/ROXICODONE) immediate release tablet 5 mg (5 mg Oral Given 08/04/18 1443)  lidocaine-EPINEPHrine (XYLOCAINE W/EPI) 2 %-1:200000 (PF) injection 10 mL (10 mLs Other Given by Other 08/04/18 1444)     Pertinent labs & imaging results that were available during my care of the patient were reviewed by me and considered in my medical decision making (see chart for details).  ____________________________________________   FINAL CLINICAL IMPRESSION(S) / ED DIAGNOSES  Final diagnoses:  Abscess of buttock, right    ED  Discharge Orders         Ordered    clindamycin (CLEOCIN) 300 MG capsule  3 times daily     08/04/18 1538    oxyCODONE-acetaminophen (PERCOCET) 5-325 MG tablet  Every 6 hours PRN     08/04/18 1538           Note:  This document was prepared using Dragon voice recognition software and may include unintentional dictation errors.   Chinita Pesterriplett, Yerick Eggebrecht B, FNP 08/04/18 1605    Jeanmarie PlantMcShane, James A, MD 08/07/18 [phone]

## 2018-08-19 IMAGING — CR DG CHEST 2V
1 series · 2 of 2 positions shown · non-contrast
Comparison: PA and lateral chest 12/04/2016 and 01/10/2016.

CLINICAL DATA: Chest pain with deep breathing for 2 weeks.

EXAM:
CHEST  2 VIEW

[Series 1: dg chest 2 view · 0.14mm/px · 2 of 2 slices shown]
[im 1/2]
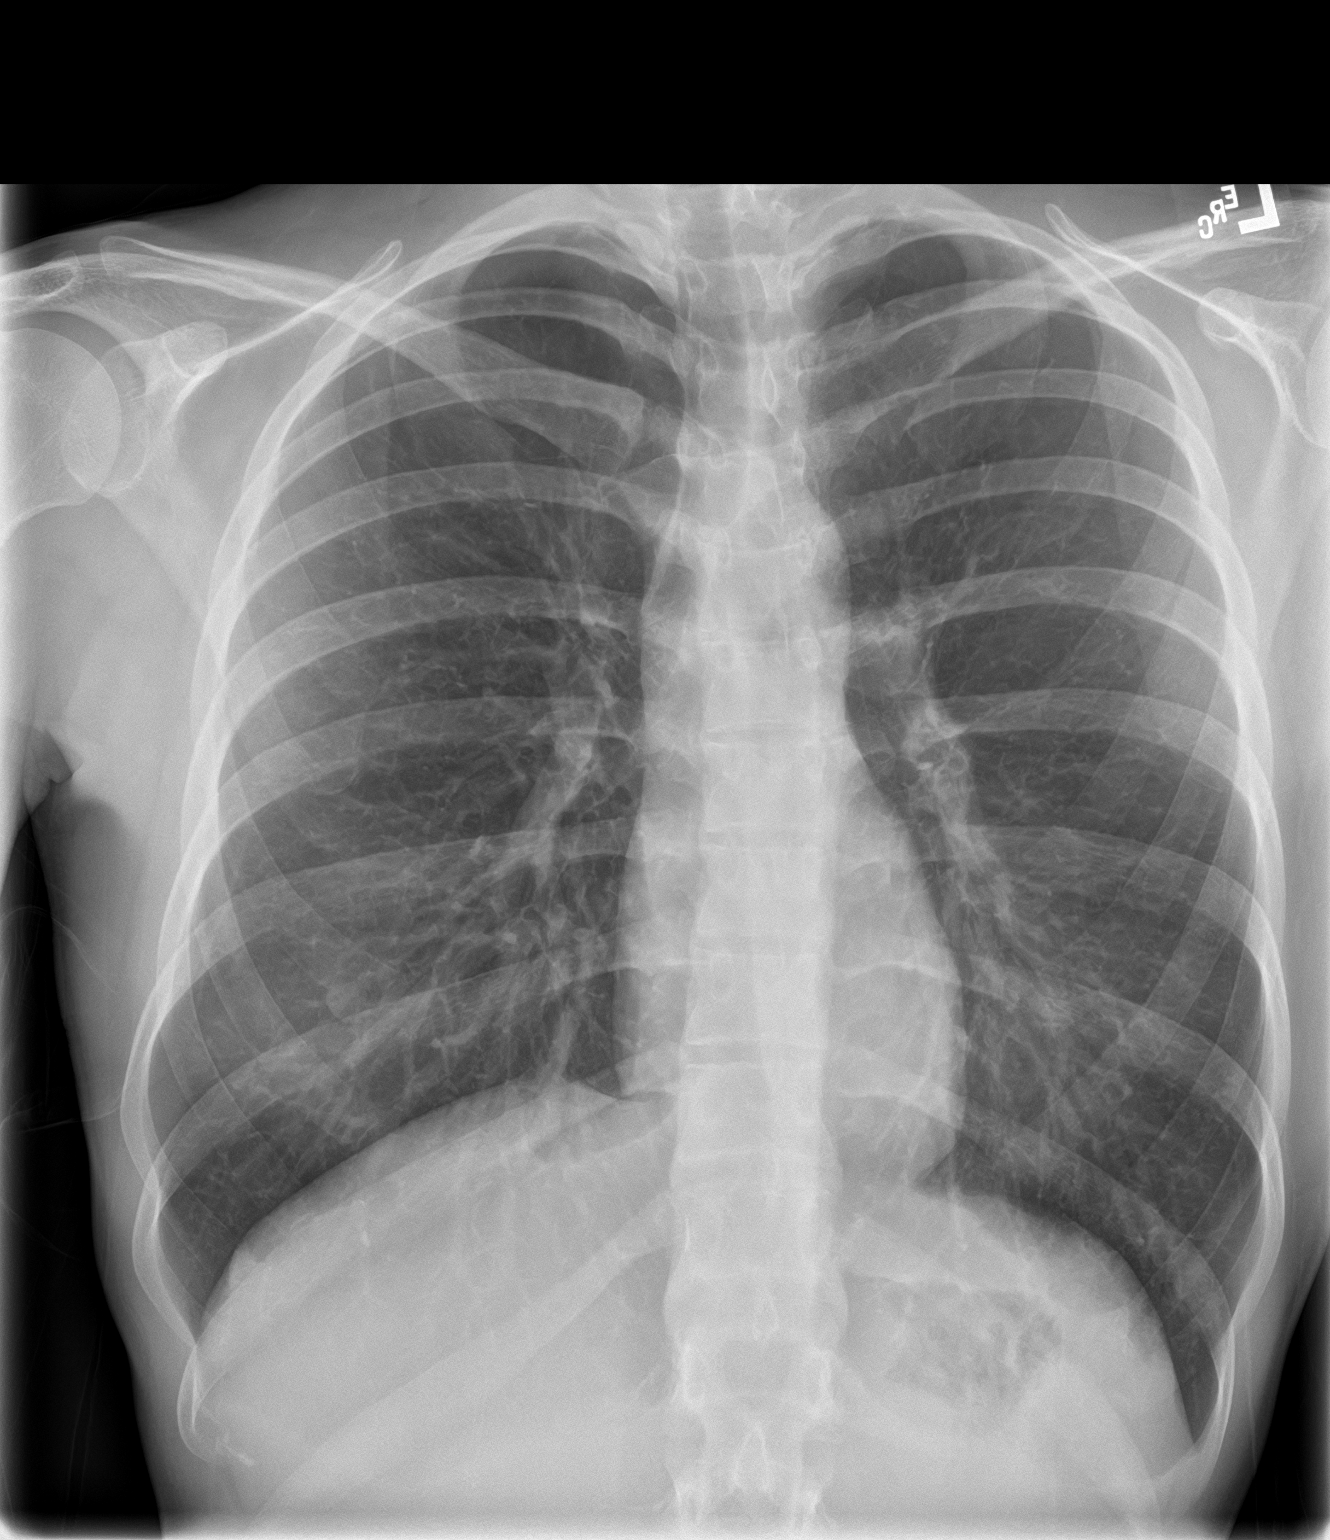
[im 2/2]
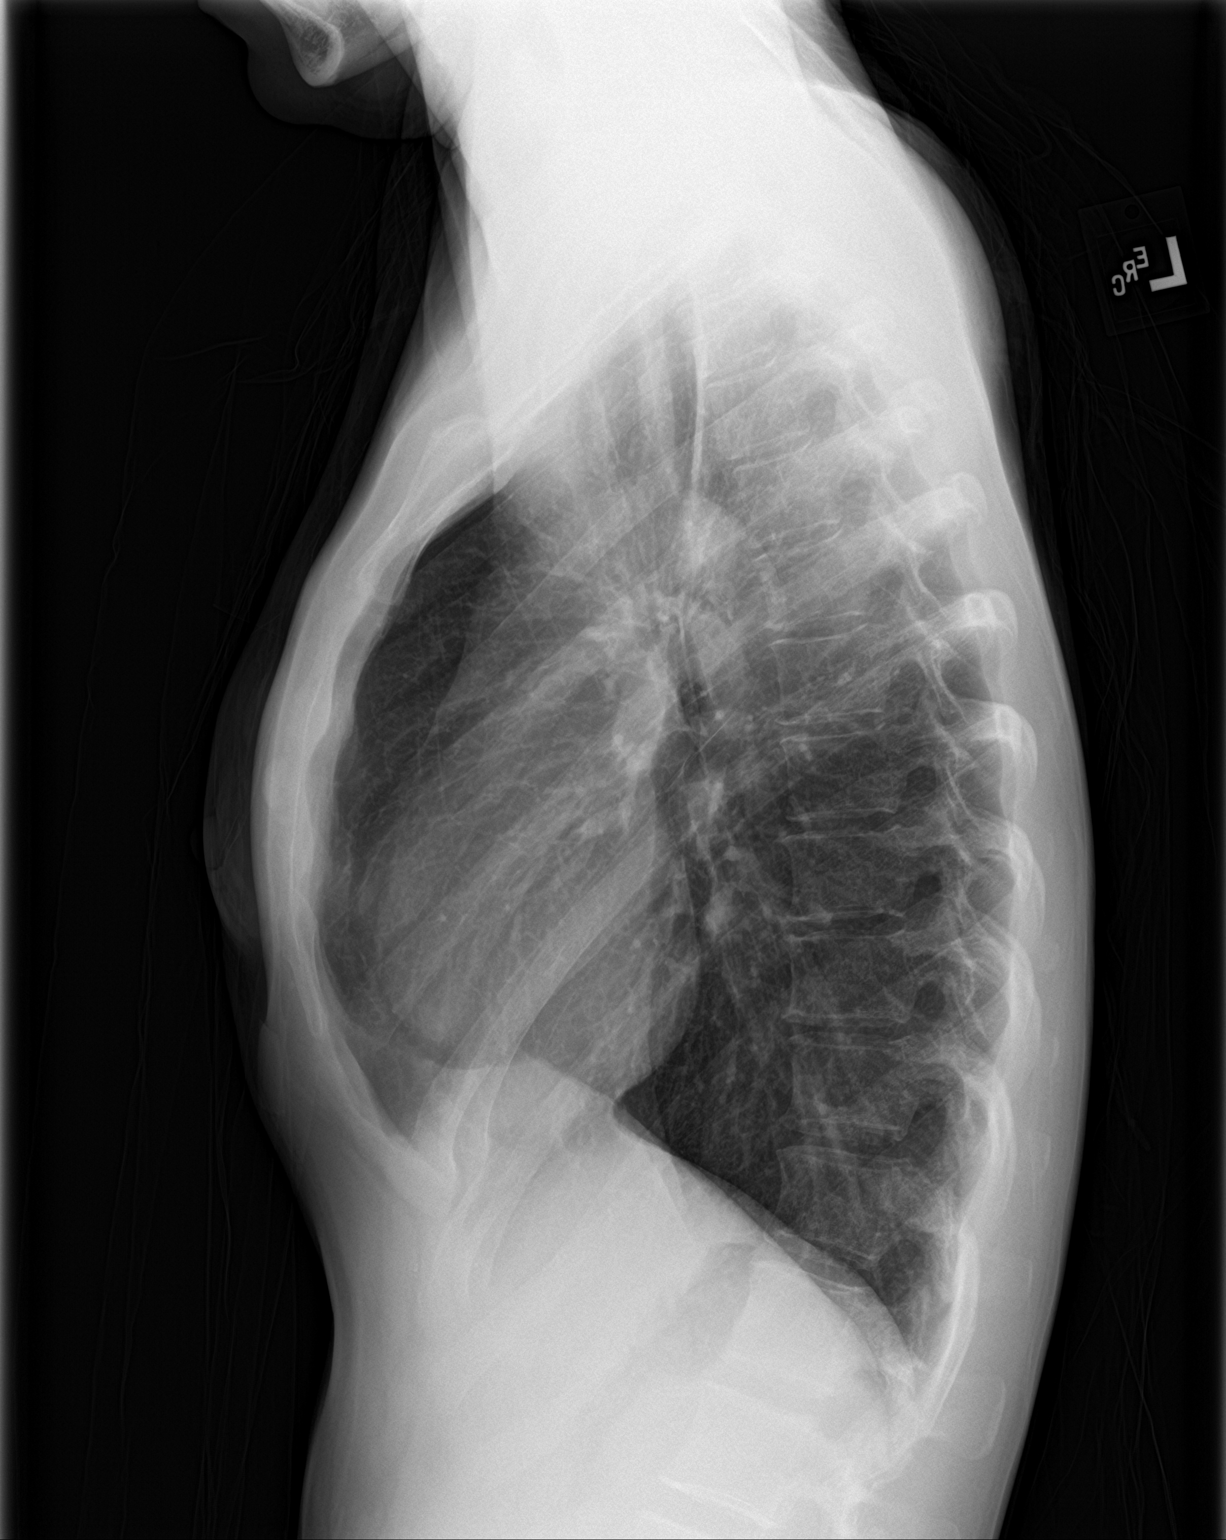

[2 of 2 positions shown; findings below may reference images not displayed]

FINDINGS: Lungs are clear. Heart size is normal. No pneumothorax or pleural
effusion. No bony abnormality.
IMPRESSION: Negative chest.

## 2018-09-03 ENCOUNTER — Telehealth: Payer: Self-pay | Admitting: Internal Medicine

## 2018-09-03 ENCOUNTER — Other Ambulatory Visit: Payer: Self-pay

## 2018-09-03 DIAGNOSIS — Z20822 Contact with and (suspected) exposure to covid-19: Secondary | ICD-10-CM

## 2018-09-03 NOTE — Telephone Encounter (Signed)
Becky from the [facility] Department called to schedule a patient for the covid-19 test.  Pt was notified and scheduled for today at the [facility] at 11:45 am. She was advised to wear a mask, stay in car with windows rolled up until ready to be tested. She voiced understanding.

## 2018-09-04 LAB — NOVEL CORONAVIRUS, NAA: SARS-CoV-2, NAA: NOT DETECTED

## 2019-01-05 ENCOUNTER — Ambulatory Visit
Admission: EM | Admit: 2019-01-05 | Discharge: 2019-01-05 | Disposition: A | Discharge status: Home / Self Care | Payer: Medicaid Other | Attending: Family | Admitting: Family

## 2019-01-05 ENCOUNTER — Other Ambulatory Visit: Payer: Self-pay

## 2019-01-05 ENCOUNTER — Encounter: Payer: Self-pay | Admitting: Emergency Medicine

## 2019-01-05 DIAGNOSIS — R590 Localized enlarged lymph nodes: Secondary | ICD-10-CM | POA: Diagnosis not present

## 2019-01-05 DIAGNOSIS — M542 Cervicalgia: Secondary | ICD-10-CM | POA: Diagnosis not present

## 2019-01-05 MED ORDER — DICLOFENAC SODIUM 75 MG PO TBEC: 75.0000 mg | DELAYED_RELEASE_TABLET | Freq: Two times a day (BID) | ORAL | 0 refills | Status: DC | PRN

## 2019-01-05 NOTE — ED Provider Notes (Signed)
[facility]    CSN: [id number] Arrival date & time: 01/05/19  [id number]      History   Chief Complaint Chief Complaint  Patient presents with  . Neck Pain    right side    HPI Vicki Black is a 28 y.o. female.   28 year old female presents with right sided neck pain and swollen glands that started 2 to 3 days ago. Denies any fever, sore throat, ear pain, nasal congestion, cough or GI symptoms. Most painful with swallowing or yawning. No known exposure to illness. Has taken Tylenol with some relief. Concerned since mom had throat cancer. Patient does not smoke. No other chronic health issues except chronic back pain and history of mood disorder- not currently on any medication.   The history is provided by the patient.    Past Medical History:  Diagnosis Date  . Anxiety   . Back pain   . Bursitis of hip    trochanteric bursitis of both hips    Patient Active Problem List   Diagnosis Date Noted  . Bipolar I disorder, current or most recent episode depressed, with psychotic features (HCC) 04/25/2017  . Acute psychosis (HCC) 04/24/2017  . Amphetamine use disorder, mild (HCC) 04/24/2017  . Brief reactive psychosis (HCC) 04/24/2017  . Severe recurrent major depression without psychotic features (HCC) 01/05/2016  . Cannabis abuse 01/05/2016  . 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 DENTAL SURGERY    . TUBAL LIGATION      OB History   No obstetric history on file.      Home Medications    Prior to Admission medications   Medication Sig Start Date End Date Taking? Authorizing Provider  diclofenac (VOLTAREN) 75 MG EC tablet Take 1 tablet (75 mg total) by mouth 2 (two) times daily as needed for moderate pain. 01/05/19   Sudie GrumblingAmyot, Irja Wheless Berry, NP  LATUDA 40 MG TABS tablet Take 1 tablet (40 mg total) by mouth at bedtime. 04/30/17 01/05/19  Pucilowska, Ellin GoodieJolanta B, MD  traZODone (DESYREL) 100 MG  tablet Take 1 tablet (100 mg total) by mouth at bedtime as needed for sleep. 04/30/17 01/05/19  Shari ProwsPucilowska, Jolanta B, MD    Family History Family History  Problem Relation Age of Onset  . Cancer Mother   . Diabetes Father     Social History Social History   Tobacco Use  . Smoking status: Former Games developermoker  . Smokeless tobacco: Never Used  Substance Use Topics  . Alcohol use: Yes    Comment: socially  . Drug use: Yes    Types: Marijuana     Allergies   Sulfa antibiotics and Vicodin [hydrocodone-acetaminophen]   Review of Systems Review of Systems  Constitutional: Negative for activity change, appetite change, chills, fatigue and fever.  HENT: Positive for trouble swallowing (painful on outside of neck when swallowing). Negative for congestion, ear discharge, ear pain, facial swelling, mouth sores, nosebleeds, postnasal drip, rhinorrhea, sinus pressure, sinus pain, sneezing and sore throat.   Eyes: Negative for pain, discharge, redness and itching.  Respiratory: Negative for cough, chest tightness, shortness of breath and wheezing.   Gastrointestinal: Negative for nausea and vomiting.  Musculoskeletal: Positive for back pain (chronic) and neck pain. Negative for arthralgias and neck stiffness.  Skin: Negative for color change, rash and wound.  Neurological: Negative for dizziness, tremors, seizures, syncope, facial asymmetry, weakness, light-headedness, numbness and headaches.  Hematological: Positive for  adenopathy. Does not bruise/bleed easily.     Physical Exam Triage Vital Signs ED Triage Vitals  Enc Vitals Group     BP 01/05/19 0828 106/63     Pulse Rate 01/05/19 0828 85     Resp 01/05/19 0828 14     Temp 01/05/19 0828 98.3 F (36.8 C)     Temp Source 01/05/19 0828 Oral     SpO2 01/05/19 0828 100 %     Weight 01/05/19 0828 120 lb (54.4 kg)     Height 01/05/19 0828 5\' 4"  (1.626 m)     Head Circumference --      Peak Flow --      Pain Score 01/05/19 0827 6      Pain Loc --      Pain Edu? --      Excl. in GC? --    No data found.  Updated Vital Signs BP 106/63 (BP Location: Left Arm)   Pulse 85   Temp 98.3 F (36.8 C) (Oral)   Resp 14   Ht 5\' 4"  (1.626 m)   Wt 120 lb (54.4 kg)   LMP 12/19/2018 (Approximate)   SpO2 100%   BMI 20.60 kg/m   Visual Acuity Right Eye Distance:   Left Eye Distance:   Bilateral Distance:    Right Eye Near:   Left Eye Near:    Bilateral Near:     Physical Exam Vitals signs and nursing note reviewed.  Constitutional:      General: She is awake. She is not in acute distress.    Appearance: She is well-developed and well-groomed. She is not ill-appearing.     Comments: Patient sitting comfortably in exam chair in no acute distress but appears tired (just got off work from 12 hours night shift).   HENT:     Head: Normocephalic and atraumatic.     Right Ear: Hearing, tympanic membrane, ear canal and external ear normal.     Left Ear: Hearing, tympanic membrane, ear canal and external ear normal.     Nose: Nose normal. No congestion or rhinorrhea.     Right Sinus: No maxillary sinus tenderness or frontal sinus tenderness.     Left Sinus: No maxillary sinus tenderness or frontal sinus tenderness.     Mouth/Throat:     Lips: Pink.     Mouth: Mucous membranes are moist.     Pharynx: Oropharynx is clear. Uvula midline. No pharyngeal swelling, oropharyngeal exudate, posterior oropharyngeal erythema or uvula swelling.  Eyes:     Extraocular Movements: Extraocular movements intact.     Conjunctiva/sclera: Conjunctivae normal.  Neck:     Musculoskeletal: Normal range of motion and neck supple. Normal range of motion.     Thyroid: No thyroid mass or thyromegaly.     Trachea: Trachea normal.      Comments: Has full range of motion of neck but some pain with rotation. Tender and swollen right lymph nodes along anterior cervical chain. No redness or rash. No other lymphadenopathy.  Cardiovascular:     Rate and  Rhythm: Normal rate and regular rhythm.     Heart sounds: Normal heart sounds. No murmur.  Pulmonary:     Effort: Pulmonary effort is normal. No respiratory distress.     Breath sounds: Normal breath sounds and air entry. No decreased breath sounds, wheezing or rhonchi.  Lymphadenopathy:     Cervical: Cervical adenopathy present.     Right cervical: Superficial cervical adenopathy and deep cervical adenopathy present.  No posterior cervical adenopathy.    Left cervical: No superficial, deep or posterior cervical adenopathy.  Skin:    General: Skin is warm and dry.     Findings: No rash.  Neurological:     General: No focal deficit present.     Mental Status: She is alert and oriented to person, place, and time.  Psychiatric:        Mood and Affect: Mood normal.        Behavior: Behavior normal. Behavior is cooperative.        Thought Content: Thought content normal.        Judgment: Judgment normal.      UC Treatments / Results  Labs (all labs ordered are listed, but only abnormal results are displayed) Labs Reviewed - No data to display  EKG   Radiology No results found.  Procedures Procedures (including critical care time)  Medications Ordered in UC Medications - No data to display  Initial Impression / Assessment and Plan / UC Course  I have reviewed the triage vital signs and the nursing notes.  Pertinent labs & imaging results that were available during my care of the patient were reviewed by me and considered in my medical decision making (see chart for details).    Reviewed with patient that she has lymph nodes that are swollen- uncertain of etiology but discussed various causes- including environmental or viral- will continue to monitor. Recommend start Voltaren 75mg  twice a day as directed. May also take Tylenol 1000mg  every 8 hours as needed. If any fever, ear pain, congestion, or sore throat occur, return for recheck. May also contact [facility] if lymph  node swelling does not resolve within 5 to 7 days. Note written for work. Follow-up as planned.  Final Clinical Impressions(s) / UC Diagnoses   Final diagnoses:  Anterior cervical lymphadenopathy  Neck pain     Discharge Instructions     Recommend start Diclofenac 75mg  twice a day as directed. May also use Tylenol 1000mg  every 8 hours as needed. Continue to monitor symptoms- if any sore throat, ear pain, fever or congestion develop, return for recheck. Otherwise follow-up with [facility] if symptoms do not resolve within 5 to 7 days.     ED Prescriptions    Medication Sig Dispense Auth. Provider   diclofenac (VOLTAREN) 75 MG EC tablet Take 1 tablet (75 mg total) by mouth 2 (two) times daily as needed for moderate pain. 20 tablet Mekiah [place], Nicholes Stairs, NP     PDMP not reviewed this encounter.   Katy Apo, NP 01/05/19 [phone]

## 2019-01-05 NOTE — Discharge Instructions (Addendum)
Recommend start Diclofenac 75mg  twice a day as directed. May also use Tylenol 1000mg  every 8 hours as needed. Continue to monitor symptoms- if any sore throat, ear pain, fever or congestion develop, return for recheck. Otherwise follow-up with [facility] if symptoms do not resolve within 5 to 7 days.

## 2019-01-05 NOTE — ED Triage Notes (Signed)
Patient c/o right sided neck pain that is worse when she coughs or swallow or yawn that started Friday.  Patient denies fevers.

## 2019-02-10 ENCOUNTER — Other Ambulatory Visit: Payer: Self-pay

## 2019-02-10 ENCOUNTER — Encounter: Payer: Self-pay | Admitting: Emergency Medicine

## 2019-02-10 ENCOUNTER — Ambulatory Visit
Admission: EM | Admit: 2019-02-10 | Discharge: 2019-02-10 | Disposition: A | Discharge status: Home / Self Care | Payer: Medicaid Other | Attending: Family Medicine | Admitting: Family Medicine

## 2019-02-10 DIAGNOSIS — F1011 Alcohol abuse, in remission: Secondary | ICD-10-CM | POA: Diagnosis not present

## 2019-02-10 DIAGNOSIS — Z792 Long term (current) use of antibiotics: Secondary | ICD-10-CM | POA: Diagnosis not present

## 2019-02-10 DIAGNOSIS — J020 Streptococcal pharyngitis: Secondary | ICD-10-CM | POA: Diagnosis not present

## 2019-02-10 DIAGNOSIS — F319 Bipolar disorder, unspecified: Secondary | ICD-10-CM | POA: Insufficient documentation

## 2019-02-10 DIAGNOSIS — Z20828 Contact with and (suspected) exposure to other viral communicable diseases: Secondary | ICD-10-CM | POA: Insufficient documentation

## 2019-02-10 DIAGNOSIS — Z882 Allergy status to sulfonamides status: Secondary | ICD-10-CM | POA: Insufficient documentation

## 2019-02-10 DIAGNOSIS — Z809 Family history of malignant neoplasm, unspecified: Secondary | ICD-10-CM | POA: Insufficient documentation

## 2019-02-10 DIAGNOSIS — Z833 Family history of diabetes mellitus: Secondary | ICD-10-CM | POA: Diagnosis not present

## 2019-02-10 DIAGNOSIS — Z885 Allergy status to narcotic agent status: Secondary | ICD-10-CM | POA: Diagnosis not present

## 2019-02-10 DIAGNOSIS — Z87891 Personal history of nicotine dependence: Secondary | ICD-10-CM

## 2019-02-10 DIAGNOSIS — F1211 Cannabis abuse, in remission: Secondary | ICD-10-CM | POA: Diagnosis not present

## 2019-02-10 DIAGNOSIS — Z79899 Other long term (current) drug therapy: Secondary | ICD-10-CM | POA: Diagnosis not present

## 2019-02-10 LAB — RAPID STREP SCREEN (MED CTR MEBANE ONLY): Streptococcus, Group A Screen (Direct): POSITIVE — AB

## 2019-02-10 MED ORDER — AMOXICILLIN 500 MG PO TABS: 500.0000 mg | ORAL_TABLET | Freq: Two times a day (BID) | ORAL | 0 refills | Status: DC

## 2019-02-10 NOTE — ED Provider Notes (Addendum)
[facility]    CSN: [id number] Arrival date & time: 02/10/19  1004  History   Chief Complaint Chief Complaint  Patient presents with  . Sore Throat  . Fever   HPI  28 year old female presents with sore throat, fever, body aches and back pain.  Started yesterday.  Patient reports fever, T-max 101.  Continues to have sore throat and severe body aches and back pain.  Rates her pain as 8/10 in severity.  She has taken aspirin without relief.  No other medications or interventions tried.  No reported sick contacts.  No known exacerbating factors.  No other complaints.  PMH, Surgical Hx, Family Hx, Social History reviewed and updated as below.  Past Medical History:  Diagnosis Date  . Anxiety   . Back pain   . Bursitis of hip    trochanteric bursitis of both hips    Patient Active Problem List   Diagnosis Date Noted  . Bipolar I disorder, current or most recent episode depressed, with psychotic features ([place]) 04/25/2017  . Acute psychosis ([place]) 04/24/2017  . Amphetamine use disorder, mild ([place]) 04/24/2017  . Brief reactive psychosis ([place]) 04/24/2017  . Severe recurrent major depression without psychotic features ([place]) 01/05/2016  . Cannabis abuse 01/05/2016  . 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 DENTAL SURGERY    . TUBAL LIGATION      OB History   No obstetric history on file.      Home Medications    Prior to Admission medications   Medication Sig Start Date End Date Taking? Authorizing Provider  amoxicillin (AMOXIL) 500 MG tablet Take 1 tablet (500 mg total) by mouth 2 (two) times daily. 02/10/19   Merlene Dante G, DO  LATUDA 40 MG TABS tablet Take 1 tablet (40 mg total) by mouth at bedtime. 04/30/17 01/05/19  Pucilowska, Wardell Honour, MD  traZODone (DESYREL) 100 MG tablet Take 1 tablet (100 mg total) by mouth at bedtime as needed for sleep. 04/30/17 01/05/19  Clovis Fredrickson, MD     Family History Family History  Problem Relation Age of Onset  . Cancer Mother   . Diabetes Father     Social History Social History   Tobacco Use  . Smoking status: Former Research scientist (life sciences)  . Smokeless tobacco: Never Used  Substance Use Topics  . Alcohol use: Yes    Comment: socially  . Drug use: Yes    Types: Marijuana     Allergies   Sulfa antibiotics and Vicodin [hydrocodone-acetaminophen]   Review of Systems Review of Systems  Constitutional: Positive for fever.  HENT: Positive for sore throat.   Musculoskeletal: Positive for back pain.       Body aches.    Physical Exam Triage Vital Signs ED Triage Vitals  Enc Vitals Group     BP 02/10/19 1021 (!) 101/53     Pulse Rate 02/10/19 1021 (!) 108     Resp 02/10/19 1021 18     Temp 02/10/19 1021 99.4 F (37.4 C)     Temp Source 02/10/19 1021 Oral     SpO2 02/10/19 1021 99 %     Weight 02/10/19 1019 130 lb (59 kg)     Height 02/10/19 1019 5\' 4"  (1.626 m)     Head Circumference --      Peak Flow --      Pain Score 02/10/19 1018 8     Pain Loc --  Pain Edu? --      Excl. in GC? --    Updated Vital Signs BP (!) 101/53 (BP Location: Right Arm)   Pulse (!) 108   Temp 99.4 F (37.4 C) (Oral)   Resp 18   Ht 5\' 4"  (1.626 m)   Wt 59 kg   LMP 02/08/2019   SpO2 99%   BMI 22.31 kg/m   Visual Acuity Right Eye Distance:   Left Eye Distance:   Bilateral Distance:    Right Eye Near:   Left Eye Near:    Bilateral Near:     Physical Exam Vitals signs and nursing note reviewed.  Constitutional:      General: She is not in acute distress.    Appearance: Normal appearance. She is not ill-appearing.  HENT:     Head: Normocephalic and atraumatic.     Right Ear: Tympanic membrane normal.     Left Ear: Tympanic membrane normal.     Mouth/Throat:     Comments: Severe oropharyngeal erythema.  Bilateral tonsillar exudates. Eyes:     General:        Right eye: No discharge.        Left eye: No discharge.      Conjunctiva/sclera: Conjunctivae normal.  Cardiovascular:     Rate and Rhythm: Normal rate and regular rhythm.     Heart sounds: No murmur.  Pulmonary:     Effort: Pulmonary effort is normal.     Breath sounds: Normal breath sounds. No wheezing, rhonchi or rales.  Skin:    General: Skin is warm.     Findings: No rash.  Neurological:     Mental Status: She is alert.  Psychiatric:     Comments: Flat affect.  Depressed mood.    UC Treatments / Results  Labs (all labs ordered are listed, but only abnormal results are displayed) Labs Reviewed  RAPID STREP SCREEN ([facility] ONLY) - Abnormal; Notable for the following components:      Result Value   Streptococcus, Group A Screen (Direct) POSITIVE (*)    All other components within normal limits  NOVEL CORONAVIRUS, NAA (HOSP ORDER, SEND-OUT TO REF LAB; TAT 18-24 HRS)    EKG   Radiology No results found.  Procedures Procedures (including critical care time)  Medications Ordered in UC Medications - No data to display  Initial Impression / Assessment and Plan / UC Course  I have reviewed the triage vital signs and the nursing notes.  Pertinent labs & imaging results that were available during my care of the patient were reviewed by me and considered in my medical decision making (see chart for details).    28 year old female presents with strep pharyngitis.  Treating with amoxicillin.  Supportive care.  Final Clinical Impressions(s) / UC Diagnoses   Final diagnoses:  Strep pharyngitis   Discharge Instructions   None    ED Prescriptions    Medication Sig Dispense Auth. Provider   amoxicillin (AMOXIL) 500 MG tablet Take 1 tablet (500 mg total) by mouth 2 (two) times daily. 20 tablet 37, DO     PDMP not reviewed this encounter.   Tommie Sams, Tommie Sams 02/10/19 1215

## 2019-02-10 NOTE — ED Triage Notes (Signed)
Pt c/o sore throat, fever (101), headache, body aches. Started yesterday. She states her throat is swollen, has white patches.

## 2019-02-11 LAB — NOVEL CORONAVIRUS, NAA (HOSP ORDER, SEND-OUT TO REF LAB; TAT 18-24 HRS): SARS-CoV-2, NAA: NOT DETECTED

## 2020-04-03 ENCOUNTER — Ambulatory Visit
Admission: EM | Admit: 2020-04-03 | Discharge: 2020-04-03 | Disposition: A | Discharge status: Home / Self Care | Payer: Medicaid Other | Attending: Family Medicine | Admitting: Family Medicine

## 2020-04-03 ENCOUNTER — Other Ambulatory Visit: Payer: Self-pay

## 2020-04-03 DIAGNOSIS — J988 Other specified respiratory disorders: Secondary | ICD-10-CM | POA: Diagnosis not present

## 2020-04-03 DIAGNOSIS — B9789 Other viral agents as the cause of diseases classified elsewhere: Secondary | ICD-10-CM | POA: Diagnosis not present

## 2020-04-03 DIAGNOSIS — Z20822 Contact with and (suspected) exposure to covid-19: Secondary | ICD-10-CM | POA: Diagnosis not present

## 2020-04-03 MED ORDER — BENZONATATE 200 MG PO CAPS: 200.0000 mg | ORAL_CAPSULE | Freq: Three times a day (TID) | ORAL | 0 refills | Status: DC | PRN

## 2020-04-03 NOTE — ED Triage Notes (Signed)
Patient states that she has been having a cough that started Tuesday. Patient states that she has been having a slight headache and some nasal congestion.

## 2020-04-03 NOTE — ED Provider Notes (Signed)
[facility]    CSN: [id number] Arrival date & time: 04/03/20  1043      History   Chief Complaint Cough  HPI  30 year old female presents with cough.  Patient reports cough, congestion, mild headache.  Started Tuesday.  No reported sick contacts or COVID-19.  No fever.  No relieving factors.  No other reported symptoms.  No other complaints.  Past Medical History:  Diagnosis Date  . Anxiety   . Back pain   . Bursitis of hip    trochanteric bursitis of both hips    Patient Active Problem List   Diagnosis Date Noted  . Bipolar I disorder, current or most recent episode depressed, with psychotic features (HCC) 04/25/2017  . Acute psychosis (HCC) 04/24/2017  . Amphetamine use disorder, mild (HCC) 04/24/2017  . Brief reactive psychosis (HCC) 04/24/2017  . Severe recurrent major depression without psychotic features (HCC) 01/05/2016  . Cannabis abuse 01/05/2016  . 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 DENTAL SURGERY    . TUBAL LIGATION      OB History   No obstetric history on file.      Home Medications    Prior to Admission medications   Medication Sig Start Date End Date Taking? Authorizing Provider  benzonatate (TESSALON) 200 MG capsule Take 1 capsule (200 mg total) by mouth 3 (three) times daily as needed for cough. 04/03/20  Yes Aneisha Skyles G, DO  meloxicam (MOBIC) 15 MG tablet Take 15 mg by mouth daily. 02/23/20  Yes [provider]  methocarbamol (ROBAXIN) 500 MG tablet Take by mouth. 02/13/20  Yes [provider]  traMADol (ULTRAM) 50 MG tablet Take 50 mg by mouth 4 (four) times daily as needed. 03/18/20  Yes [provider]  LATUDA 40 MG TABS tablet Take 1 tablet (40 mg total) by mouth at bedtime. 04/30/17 01/05/19  Pucilowska, Ellin Goodie, MD  traZODone (DESYREL) 100 MG tablet Take 1 tablet (100 mg total) by mouth at bedtime as needed for sleep. 04/30/17  01/05/19  Shari Prows, MD    Family History Family History  Problem Relation Age of Onset  . Cancer Mother   . Diabetes Father     Social History Social History   Tobacco Use  . Smoking status: Former Games developer  . Smokeless tobacco: Never Used  Vaping Use  . Vaping Use: Never used  Substance Use Topics  . Alcohol use: Yes    Comment: socially  . Drug use: Yes    Types: Marijuana     Allergies   Sulfa antibiotics and Vicodin [hydrocodone-acetaminophen]   Review of Systems Review of Systems  Constitutional: Negative for fever.  HENT: Positive for congestion.   Respiratory: Positive for cough.   Neurological: Positive for headaches.   Physical Exam Triage Vital Signs ED Triage Vitals  Enc Vitals Group     BP 04/03/20 1226 (!) 113/52     Pulse Rate 04/03/20 1226 79     Resp 04/03/20 1226 18     Temp 04/03/20 1226 98.2 F (36.8 C)     Temp Source 04/03/20 1226 Oral     SpO2 04/03/20 1226 100 %     Weight 04/03/20 1225 125 lb (56.7 kg)     Height 04/03/20 1225 5\' 5"  (1.651 m)     Head Circumference --      Peak Flow --      Pain Score 04/03/20  1224 0     Pain Loc --      Pain Edu? --      Excl. in GC? --    Updated Vital Signs BP (!) 113/52 (BP Location: Left Arm)   Pulse 79   Temp 98.2 F (36.8 C) (Oral)   Resp 18   Ht 5\' 5"  (1.651 m)   Wt 56.7 kg   LMP 03/27/2020   SpO2 100%   BMI 20.80 kg/m   Visual Acuity Right Eye Distance:   Left Eye Distance:   Bilateral Distance:    Right Eye Near:   Left Eye Near:    Bilateral Near:     Physical Exam Vitals and nursing note reviewed.  Constitutional:      General: She is not in acute distress.    Appearance: Normal appearance. She is not ill-appearing.  HENT:     Head: Normocephalic and atraumatic.  Eyes:     General:        Right eye: No discharge.        Left eye: No discharge.     Conjunctiva/sclera: Conjunctivae normal.  Cardiovascular:     Rate and Rhythm: Normal rate and  regular rhythm.     Heart sounds: No murmur heard.   Pulmonary:     Effort: Pulmonary effort is normal.     Breath sounds: Normal breath sounds. No wheezing, rhonchi or rales.  Neurological:     Mental Status: She is alert.  Psychiatric:        Mood and Affect: Mood normal.        Behavior: Behavior normal.    UC Treatments / Results  Labs (all labs ordered are listed, but only abnormal results are displayed) Labs Reviewed  SARS CORONAVIRUS 2 (TAT 6-24 HRS)    EKG   Radiology No results found.  Procedures Procedures (including critical care time)  Medications Ordered in UC Medications - No data to display  Initial Impression / Assessment and Plan / UC Course  I have reviewed the triage vital signs and the nursing notes.  Pertinent labs & imaging results that were available during my care of the patient were reviewed by me and considered in my medical decision making (see chart for details).    30 year old female presents with a viral respiratory infection.  Awaiting Covid test results.  Tessalon Perles for cough  Final Clinical Impressions(s) / UC Diagnoses   Final diagnoses:  Viral respiratory infection     Discharge Instructions     Medication as prescribed.  Results should be available in the next 24 hours. Check mychart account.  Take care  Dr. 26    ED Prescriptions    Medication Sig Dispense Auth. Provider   benzonatate (TESSALON) 200 MG capsule Take 1 capsule (200 mg total) by mouth 3 (three) times daily as needed for cough. 30 capsule Adriana Simas, DO     PDMP not reviewed this encounter.   Tommie Sams, Tommie Sams 04/03/20 1416

## 2020-04-03 NOTE — Discharge Instructions (Signed)
Medication as prescribed.  Results should be available in the next 24 hours. Check mychart account.  Take care  Dr. Adriana Simas

## 2020-04-04 LAB — SARS CORONAVIRUS 2 (TAT 6-24 HRS): SARS Coronavirus 2: NEGATIVE

## 2020-04-08 ENCOUNTER — Ambulatory Visit: Payer: Medicaid Other

## 2020-04-21 ENCOUNTER — Ambulatory Visit: Payer: Medicaid Other
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
## 2020-05-18 ENCOUNTER — Ambulatory Visit: Payer: Medicaid Other | Admitting: Physical Therapy

## 2020-05-25 ENCOUNTER — Ambulatory Visit
Admission: EM | Admit: 2020-05-25 | Discharge: 2020-05-25 | Disposition: A | Discharge status: Home / Self Care | Payer: Medicaid Other | Attending: Emergency Medicine | Admitting: Emergency Medicine

## 2020-05-25 ENCOUNTER — Other Ambulatory Visit: Payer: Self-pay

## 2020-05-25 DIAGNOSIS — R1012 Left upper quadrant pain: Secondary | ICD-10-CM

## 2020-05-25 LAB — COMPREHENSIVE METABOLIC PANEL
ALT: 15 U/L (ref 0–44)
AST: 16 U/L (ref 15–41)
Albumin: 4.6 g/dL (ref 3.5–5.0)
Alkaline Phosphatase: 35 U/L — ABNORMAL LOW (ref 38–126)
Anion gap: 9 (ref 5–15)
BUN: 14 mg/dL (ref 6–20)
CO2: 29 mmol/L (ref 22–32)
Calcium: 9.6 mg/dL (ref 8.9–10.3)
Chloride: 101 mmol/L (ref 98–111)
Creatinine, Ser: 0.75 mg/dL (ref 0.44–1.00)
GFR, Estimated: >60 mL/min (ref >60)
Glucose, Bld: 88 mg/dL (ref 70–99)
Potassium: 3.8 mmol/L (ref 3.5–5.1)
Sodium: 139 mmol/L (ref 135–145)
Total Bilirubin: 0.4 mg/dL (ref 0.3–1.2)
Total Protein: 7.8 g/dL (ref 6.5–8.1)

## 2020-05-25 LAB — URINALYSIS, COMPLETE (UACMP) WITH MICROSCOPIC
Bilirubin Urine: NEGATIVE
Glucose, UA: NEGATIVE mg/dL
Leukocytes,Ua: NEGATIVE
Nitrite: NEGATIVE
Protein, ur: NEGATIVE mg/dL
Specific Gravity, Urine: 1.025 (ref 1.005–1.030)
WBC, UA: NONE SEEN WBC/hpf (ref 0–5)
pH: 7 (ref 5.0–8.0)

## 2020-05-25 LAB — CBC WITH DIFFERENTIAL/PLATELET
Abs Immature Granulocytes: 0.01 10*3/uL (ref 0.00–0.07)
Basophils Absolute: 0 10*3/uL (ref 0.0–0.1)
Basophils Relative: 1 %
Eosinophils Absolute: 0.1 10*3/uL (ref 0.0–0.5)
Eosinophils Relative: 2 %
HCT: 40.1 % (ref 36.0–46.0)
Hemoglobin: 13.5 g/dL (ref 12.0–15.0)
Immature Granulocytes: 0 %
Lymphocytes Relative: 44 %
Lymphs Abs: 2.3 10*3/uL (ref 0.7–4.0)
MCH: 31.4 pg (ref 26.0–34.0)
MCHC: 33.7 g/dL (ref 30.0–36.0)
MCV: 93.3 fL (ref 80.0–100.0)
Monocytes Absolute: 0.5 10*3/uL (ref 0.1–1.0)
Monocytes Relative: 10 %
Neutro Abs: 2.2 10*3/uL (ref 1.7–7.7)
Neutrophils Relative %: 43 %
Platelets: 306 10*3/uL (ref 150–400)
RBC: 4.3 MIL/uL (ref 3.87–5.11)
RDW: 13 % (ref 11.5–15.5)
WBC: 5.2 10*3/uL (ref 4.0–10.5)
nRBC: 0 % (ref 0.0–0.2)

## 2020-05-25 LAB — LIPASE, BLOOD: Lipase: 26 U/L (ref 11–51)

## 2020-05-25 LAB — PREGNANCY, URINE: Preg Test, Ur: NEGATIVE

## 2020-05-25 MED ORDER — LIDOCAINE VISCOUS HCL 2 % MT SOLN
15.0000 mL | Freq: Once | OROMUCOSAL | Status: AC
  Administered 2020-05-25: 15 mL via ORAL

## 2020-05-25 MED ORDER — FAMOTIDINE 20 MG PO TABS: 20.0000 mg | ORAL_TABLET | Freq: Two times a day (BID) | ORAL | 0 refills | Status: DC

## 2020-05-25 MED ORDER — ONDANSETRON 8 MG PO TBDP
8.0000 mg | ORAL_TABLET | Freq: Once | ORAL | Status: AC
  Administered 2020-05-25: 8 mg via ORAL

## 2020-05-25 MED ORDER — ALUM & MAG HYDROXIDE-SIMETH 200-200-20 MG/5ML PO SUSP
30.0000 mL | Freq: Once | ORAL | Status: AC
  Administered 2020-05-25: 30 mL via ORAL

## 2020-05-25 MED ORDER — ONDANSETRON 4 MG PO TBDP: 4.0000 mg | ORAL_TABLET | Freq: Three times a day (TID) | ORAL | 0 refills | Status: DC | PRN

## 2020-05-25 MED ORDER — IBUPROFEN 600 MG PO TABS: 600.0000 mg | ORAL_TABLET | Freq: Four times a day (QID) | ORAL | 0 refills | Status: DC | PRN

## 2020-05-25 MED ORDER — KETOROLAC TROMETHAMINE 60 MG/2ML IM SOLN
30.0000 mg | Freq: Once | INTRAMUSCULAR | Status: AC
  Administered 2020-05-25: 30 mg via INTRAMUSCULAR

## 2020-05-25 NOTE — ED Provider Notes (Signed)
HPI  SUBJECTIVE:  Vicki Black is a 30 y.o. female who presents with sharp, constant left upper quadrant pain described as "being on fire" starting after having a loose bowel movement this morning at 0500 today. She reports nausea, but no vomiting. No fevers, cough, chest pain, shortness of breath, abdominal pain, back pain. She had 2 drinks of alcohol last night. No recent sore throat. No urinary complaints. She tried eating with worsening of her symptoms. States the pain went across her entire upper abdomen after she ate. No alleviating factors. Car ride over here was not painful. Past medical history negative for peptic ulcer disease, gastritis, gallbladder disease, pancreatitis, diabetes, hypertension, illicit drug use, mesenteric ischemia, coronary disease, MI, A. fib, hypercholesterolemia, hypercoagulability, pyelonephritis, nephrolithiasis. She has a past medical history of UTIs. LMP: Now. Denies the possibility being pregnant. PMD: [facility]    Past Medical History:  Diagnosis Date  . Anxiety   . Back pain   . Bursitis of hip    trochanteric bursitis of both hips    Past Surgical History:  Procedure Laterality Date  . DENTAL SURGERY    . TUBAL LIGATION      Family History  Problem Relation Age of Onset  . Cancer Mother   . Diabetes Father     Social History   Tobacco Use  . Smoking status: Former Games developer  . Smokeless tobacco: Never Used  Vaping Use  . Vaping Use: Never used  Substance Use Topics  . Alcohol use: Yes    Comment: socially  . Drug use: Yes    Types: Marijuana    No current facility-administered medications for this encounter.  Current Outpatient Medications:  .  famotidine (PEPCID) 20 MG tablet, Take 1 tablet (20 mg total) by mouth 2 (two) times daily., Disp: 40 tablet, Rfl: 0 .  ibuprofen (ADVIL) 600 MG tablet, Take 1 tablet (600 mg total) by mouth every 6 (six) hours as needed., Disp: 30 tablet, Rfl: 0 .  ondansetron (ZOFRAN ODT) 4  MG disintegrating tablet, Take 1 tablet (4 mg total) by mouth every 8 (eight) hours as needed for nausea or vomiting., Disp: 20 tablet, Rfl: 0  Allergies  Allergen Reactions  . Sulfa Antibiotics Nausea And Vomiting  . Vicodin [Hydrocodone-Acetaminophen]      ROS  As noted in HPI.   Physical Exam  BP 108/66 (BP Location: Left Arm)   Pulse 89   Temp 98.4 F (36.9 C) (Oral)   Resp 16   Wt 54.4 kg   SpO2 100%   BMI 19.97 kg/m   Constitutional: Well developed, well nourished, no acute distress Eyes:  EOMI, conjunctiva normal bilaterally HENT: Normocephalic, atraumatic,mucus membranes moist Respiratory: Normal inspiratory effort, lungs clear bilaterally.  Good air movement. Cardiovascular: Normal rate, regular rhythm, no murmurs rubs or gallops GI: Normal appearance, flat, soft.  Positive suprapubic and left upper quadrant tenderness.  No splenomegaly.  Negative McBurney, no other abdominal tenderness.  No rebound, guarding.  Negative tap table test.  Active bowel sounds. Back: No CVAT skin: No rash, skin intact Musculoskeletal: no deformities Neurologic: Alert & oriented x 3, no focal neuro deficits Psychiatric: Speech and behavior appropriate   ED Course   Medications  ondansetron (ZOFRAN-ODT) disintegrating tablet 8 mg (8 mg Oral Given 05/25/20 1540)  ketorolac (TORADOL) injection 30 mg (30 mg Intramuscular Given 05/25/20 1542)  alum & mag hydroxide-simeth (MAALOX/MYLANTA) 200-200-20 MG/5ML suspension 30 mL (30 mLs Oral Given 05/25/20 1557)    And  lidocaine (XYLOCAINE) 2 % viscous mouth solution 15 mL (15 mLs Oral Given 05/25/20 1558)    Orders Placed This Encounter  Procedures  . Urine culture    Standing Status:   Standing    Number of Occurrences:   1  . Urinalysis, Complete w Microscopic    Standing Status:   Standing    Number of Occurrences:   1  . CBC with Differential    Standing Status:   Standing    Number of Occurrences:   1  . Pregnancy, urine     Standing Status:   Standing    Number of Occurrences:   1  . Comprehensive metabolic panel    Standing Status:   Standing    Number of Occurrences:   1  . Lipase, blood    Standing Status:   Standing    Number of Occurrences:   1    Results for orders placed or performed during the hospital encounter of 05/25/20 (from the past 24 hour(s))  Urinalysis, Complete w Microscopic Urine, Clean Catch     Status: Abnormal   Collection Time: 05/25/20  3:45 PM  Result Value Ref Range   Color, Urine YELLOW YELLOW   APPearance CLEAR CLEAR   Specific Gravity, Urine 1.025 1.005 - 1.030   pH 7.0 5.0 - 8.0   Glucose, UA NEGATIVE NEGATIVE mg/dL   Hgb urine dipstick TRACE (A) NEGATIVE   Bilirubin Urine NEGATIVE NEGATIVE   Ketones, ur TRACE (A) NEGATIVE mg/dL   Protein, ur NEGATIVE NEGATIVE mg/dL   Nitrite NEGATIVE NEGATIVE   Leukocytes,Ua NEGATIVE NEGATIVE   Squamous Epithelial / LPF 0-5 0 - 5   WBC, UA NONE SEEN 0 - 5 WBC/hpf   RBC / HPF 6-10 0 - 5 RBC/hpf   Bacteria, UA FEW (A) NONE SEEN   Mucus PRESENT    Ca Oxalate Crys, UA PRESENT   Pregnancy, urine     Status: None   Collection Time: 05/25/20  4:37 PM  Result Value Ref Range   Preg Test, Ur NEGATIVE NEGATIVE  CBC with Differential     Status: None   Collection Time: 05/25/20  4:41 PM  Result Value Ref Range   WBC 5.2 4.0 - 10.5 K/uL   RBC 4.30 3.87 - 5.11 MIL/uL   Hemoglobin 13.5 12.0 - 15.0 g/dL   HCT 61.4 43.1 - 54.0 %   MCV 93.3 80.0 - 100.0 fL   MCH 31.4 26.0 - 34.0 pg   MCHC 33.7 30.0 - 36.0 g/dL   RDW 08.6 76.1 - 95.0 %   Platelets 306 150 - 400 K/uL   nRBC 0.0 0.0 - 0.2 %   Neutrophils Relative % 43 %   Neutro Abs 2.2 1.7 - 7.7 K/uL   Lymphocytes Relative 44 %   Lymphs Abs 2.3 0.7 - 4.0 K/uL   Monocytes Relative 10 %   Monocytes Absolute 0.5 0.1 - 1.0 K/uL   Eosinophils Relative 2 %   Eosinophils Absolute 0.1 0.0 - 0.5 K/uL   Basophils Relative 1 %   Basophils Absolute 0.0 0.0 - 0.1 K/uL   Immature Granulocytes 0 %    Abs Immature Granulocytes 0.01 0.00 - 0.07 K/uL  Comprehensive metabolic panel     Status: Abnormal   Collection Time: 05/25/20  4:41 PM  Result Value Ref Range   Sodium 139 135 - 145 mmol/L   Potassium 3.8 3.5 - 5.1 mmol/L   Chloride 101 98 - 111 mmol/L  CO2 29 22 - 32 mmol/L   Glucose, Bld 88 70 - 99 mg/dL   BUN 14 6 - 20 mg/dL   Creatinine, Ser 0.81 0.44 - 1.00 mg/dL   Calcium 9.6 8.9 - 44.8 mg/dL   Total Protein 7.8 6.5 - 8.1 g/dL   Albumin 4.6 3.5 - 5.0 g/dL   AST 16 15 - 41 U/L   ALT 15 0 - 44 U/L   Alkaline Phosphatase 35 (L) 38 - 126 U/L   Total Bilirubin 0.4 0.3 - 1.2 mg/dL   GFR, Estimated >18 >56 mL/min   Anion gap 9 5 - 15  Lipase, blood     Status: None   Collection Time: 05/25/20  4:41 PM  Result Value Ref Range   Lipase 26 11 - 51 U/L   No results found.  ED Clinical Impression  1. Left upper quadrant abdominal pain      ED Assessment/Plan  Patient is on menses, which could explain her suprapubic tenderness, but she states it has not been a particularly severe period.  Will check a UA.  Suspect a gastroenteritis or gastritis causing her upper abdominal symptoms.  Doubt perforated ulcer, cholecystitis, pancreatitis, obstruction, pneumonia, mono, ACS, mesenteric ischemia.  This would be a very atypical presentation for nephrolithiasis.  Patient has no previous history of nephrolithiasis.  Giving Toradol 30 mg IM, Zofran 8 mg p.o, GI cocktail.  Will reevaluate  On reevaluation, patient states that she does not feel any better.  Will check CBC, CMP, lipase  Lipase, CBC, CMP unremarkable.  Patient has hematuria, but she is on menses.  She has trace ketones and few bacteria.  She also has calcium oxalate crystals, which can be present in normal acidic urine.   will send this off for culture prior to initiating antibiotic treatment for UTI  Discussed labs, MDM, treatment plan, and plan for follow-up with patient. Discussed sn/sx that should prompt return to the  ED. patient agrees with plan.   Meds ordered this encounter  Medications  . ondansetron (ZOFRAN-ODT) disintegrating tablet 8 mg  . ketorolac (TORADOL) injection 30 mg  . AND Linked Order Group   . alum & mag hydroxide-simeth (MAALOX/MYLANTA) 200-200-20 MG/5ML suspension 30 mL   . lidocaine (XYLOCAINE) 2 % viscous mouth solution 15 mL  . famotidine (PEPCID) 20 MG tablet    Sig: Take 1 tablet (20 mg total) by mouth 2 (two) times daily.    Dispense:  40 tablet    Refill:  0  . ondansetron (ZOFRAN ODT) 4 MG disintegrating tablet    Sig: Take 1 tablet (4 mg total) by mouth every 8 (eight) hours as needed for nausea or vomiting.    Dispense:  20 tablet    Refill:  0  . ibuprofen (ADVIL) 600 MG tablet    Sig: Take 1 tablet (600 mg total) by mouth every 6 (six) hours as needed.    Dispense:  30 tablet    Refill:  0    *This clinic note was created using Scientist, clinical (histocompatibility and immunogenetics). Therefore, there may be occasional mistakes despite careful proofreading.   ?    Domenick Gong, MD 05/25/20 2002

## 2020-05-25 NOTE — Discharge Instructions (Addendum)
Your CBC, comprehensive metabolic panel and lipase were all normal.  It does not appear to be your pancreas, liver, or your gallbladder.  You do have some bacteria in your urine which could explain the suprapubic tenderness, however because you have no urinary complaints, I am going to send your urine off for culture prior to starting you on treatment for urinary tract infection.  Try the Pepcid, 600 mg of ibuprofen combined with 1000 mg of Tylenol as needed for pain, Zofran.  Go immediately to the ER if your pain gets worse, fevers above 100.4, or for other concerns.

## 2020-05-25 NOTE — ED Triage Notes (Addendum)
Patient presents to Urgent Care with complaints of headache, epigastric pain and nausea since this morning. Pt concerned with elevated heart rate. She states she has noticed frequent episodes of her heart racing. She has a hx of anxiety.   Denies fever or vomiting.

## 2020-05-28 ENCOUNTER — Ambulatory Visit
Admission: RE | Admit: 2020-05-28 | Discharge: 2020-05-28 | Disposition: A | Discharge status: Home / Self Care | Payer: Medicaid Other | Source: Ambulatory Visit | Attending: Family Medicine | Admitting: Family Medicine

## 2020-05-28 ENCOUNTER — Other Ambulatory Visit: Payer: Self-pay

## 2020-05-28 VITALS — BP 107/63 | HR 77 | Temp 98.6°F | Resp 14 | Ht 65.0 in | Wt 124.0 lb

## 2020-05-28 DIAGNOSIS — Z882 Allergy status to sulfonamides status: Secondary | ICD-10-CM | POA: Diagnosis not present

## 2020-05-28 DIAGNOSIS — R1010 Upper abdominal pain, unspecified: Secondary | ICD-10-CM | POA: Diagnosis not present

## 2020-05-28 DIAGNOSIS — Z885 Allergy status to narcotic agent status: Secondary | ICD-10-CM | POA: Diagnosis not present

## 2020-05-28 DIAGNOSIS — Z87891 Personal history of nicotine dependence: Secondary | ICD-10-CM | POA: Insufficient documentation

## 2020-05-28 DIAGNOSIS — R519 Headache, unspecified: Secondary | ICD-10-CM | POA: Diagnosis not present

## 2020-05-28 DIAGNOSIS — Z20822 Contact with and (suspected) exposure to covid-19: Secondary | ICD-10-CM | POA: Diagnosis not present

## 2020-05-28 DIAGNOSIS — Z79899 Other long term (current) drug therapy: Secondary | ICD-10-CM | POA: Diagnosis not present

## 2020-05-28 LAB — SARS CORONAVIRUS 2 (TAT 6-24 HRS): SARS Coronavirus 2: NEGATIVE

## 2020-05-28 MED ORDER — PANTOPRAZOLE SODIUM 40 MG PO TBEC: 40.0000 mg | DELAYED_RELEASE_TABLET | Freq: Every day | ORAL | 0 refills | Status: DC

## 2020-05-28 NOTE — ED Triage Notes (Signed)
Patient c/o upper abdominal pain since Tuesday.  Patient states that she was seen here on 05/25/20 for the same symptoms and has been taking the medicines as prescribed.  Patient reports headache today. Patient denies N/V/D.

## 2020-05-28 NOTE — ED Provider Notes (Signed)
[facility]    CSN: [id number] Arrival date & time: 05/28/20  1038      History   Chief Complaint Chief Complaint  Patient presents with  . Abdominal Pain  . Headache   HPI  30 year old female presents with the above complaints.  Patient recently seen on Tuesday by Dr. Chaney Malling. Had a thorough work-up and exam. Negative laboratory work-up. Was placed on Pepcid, Zofran for suspected gastritis. Patient states that she is still having symptoms. She still having upper abdominal pain. Some associated nausea. No vomiting. Decrease in appetite. Patient states that she feels hungry but is scared to eat due to abdominal pain. She states that she has now developed a headache as well. She is concerned about the possibility of COVID-19. Desires testing today. No known exacerbating factors. No other associated symptoms. No other complaints.  Past Medical History:  Diagnosis Date  . Anxiety   . Back pain   . Bursitis of hip    trochanteric bursitis of both hips    Patient Active Problem List   Diagnosis Date Noted  . Bipolar I disorder, current or most recent episode depressed, with psychotic features (HCC) 04/25/2017  . Acute psychosis (HCC) 04/24/2017  . Amphetamine use disorder, mild (HCC) 04/24/2017  . Brief reactive psychosis (HCC) 04/24/2017  . Severe recurrent major depression without psychotic features (HCC) 01/05/2016  . Cannabis abuse 01/05/2016  . 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 DENTAL SURGERY    . TUBAL LIGATION      OB History   No obstetric history on file.      Home Medications    Prior to Admission medications   Medication Sig Start Date End Date Taking? Authorizing Provider  famotidine (PEPCID) 20 MG tablet Take 1 tablet (20 mg total) by mouth 2 (two) times daily. 05/25/20  Yes Domenick Gong, MD  ibuprofen (ADVIL) 600 MG tablet Take 1 tablet (600 mg total) by mouth  every 6 (six) hours as needed. 05/25/20  Yes Domenick Gong, MD  ondansetron (ZOFRAN ODT) 4 MG disintegrating tablet Take 1 tablet (4 mg total) by mouth every 8 (eight) hours as needed for nausea or vomiting. 05/25/20  Yes Domenick Gong, MD  pantoprazole (PROTONIX) 40 MG tablet Take 1 tablet (40 mg total) by mouth daily. 05/28/20  Yes Marticia Reifschneider G, DO  LATUDA 40 MG TABS tablet Take 1 tablet (40 mg total) by mouth at bedtime. 04/30/17 01/05/19  Pucilowska, Ellin Goodie, MD  traZODone (DESYREL) 100 MG tablet Take 1 tablet (100 mg total) by mouth at bedtime as needed for sleep. 04/30/17 01/05/19  Shari Prows, MD    Family History Family History  Problem Relation Age of Onset  . Cancer Mother   . Diabetes Father     Social History Social History   Tobacco Use  . Smoking status: Former Games developer  . Smokeless tobacco: Never Used  Vaping Use  . Vaping Use: Never used  Substance Use Topics  . Alcohol use: Yes    Comment: socially  . Drug use: Yes    Types: Marijuana     Allergies   Sulfa antibiotics and Vicodin [hydrocodone-acetaminophen]   Review of Systems Review of Systems  Gastrointestinal: Positive for abdominal pain.  Neurological: Positive for headaches.   Physical Exam Triage Vital Signs ED Triage Vitals  Enc Vitals Group     BP 05/28/20 1049 107/63     Pulse Rate  05/28/20 1049 77     Resp 05/28/20 1049 14     Temp 05/28/20 1049 98.6 F (37 C)     Temp Source 05/28/20 1049 Oral     SpO2 05/28/20 1049 100 %     Weight 05/28/20 1046 124 lb (56.2 kg)     Height 05/28/20 1046 5\' 5"  (1.651 m)     Head Circumference --      Peak Flow --      Pain Score 05/28/20 1046 6     Pain Loc --      Pain Edu? --      Excl. in GC? --    Updated Vital Signs BP 107/63 (BP Location: Left Arm)   Pulse 77   Temp 98.6 F (37 C) (Oral)   Resp 14   Ht 5\' 5"  (1.651 m)   Wt 56.2 kg   LMP 05/22/2020 (Exact Date)   SpO2 100%   BMI 20.63 kg/m   Visual Acuity Right Eye  Distance:   Left Eye Distance:   Bilateral Distance:    Right Eye Near:   Left Eye Near:    Bilateral Near:     Physical Exam Vitals and nursing note reviewed.  Constitutional:      General: She is not in acute distress.    Appearance: Normal appearance. She is not ill-appearing.  HENT:     Head: Normocephalic and atraumatic.  Eyes:     General:        Right eye: No discharge.        Left eye: No discharge.     Conjunctiva/sclera: Conjunctivae normal.  Cardiovascular:     Rate and Rhythm: Normal rate and regular rhythm.  Pulmonary:     Effort: Pulmonary effort is normal.     Breath sounds: Normal breath sounds. No wheezing, rhonchi or rales.  Abdominal:     General: There is no distension.     Palpations: Abdomen is soft.     Comments: Mild tenderness in the epigastric region  Neurological:     Mental Status: She is alert.  Psychiatric:        Mood and Affect: Mood normal.        Behavior: Behavior normal.    UC Treatments / Results  Labs (all labs ordered are listed, but only abnormal results are displayed) Labs Reviewed  SARS CORONAVIRUS 2 (TAT 6-24 HRS)    EKG   Radiology No results found.  Procedures Procedures (including critical care time)  Medications Ordered in UC Medications - No data to display  Initial Impression / Assessment and Plan / UC Course  I have reviewed the triage vital signs and the nursing notes.  Pertinent labs & imaging results that were available during my care of the patient were reviewed by me and considered in my medical decision making (see chart for details).    30 year old female presents with upper abdominal pain and headache. Awaiting Covid test results. Placing on Protonix for upper abdominal pain which is likely gastritis. Supportive care.  Final Clinical Impressions(s) / UC Diagnoses   Final diagnoses:  Upper abdominal pain  Acute nonintractable headache, unspecified headache type     Discharge Instructions      Medication as prescribed.  Stay home.  Check my chart for COVID test results.  Take care  Dr. 05/24/2020     ED Prescriptions    Medication Sig Dispense Auth. Provider   pantoprazole (PROTONIX) 40 MG tablet Take 1 tablet (40  mg total) by mouth daily. 30 tablet Tommie Sams, DO     PDMP not reviewed this encounter.   Tommie Sams, DO 05/28/20 1213

## 2020-05-28 NOTE — Discharge Instructions (Signed)
Medication as prescribed.  Stay home.  Check my chart for COVID test results.  Take care  Dr. Eboni Coval   

## 2020-05-29 LAB — URINE CULTURE: Culture: >=100000 — AB

## 2020-05-31 ENCOUNTER — Telehealth (HOSPITAL_COMMUNITY): Payer: Self-pay | Admitting: Emergency Medicine

## 2020-05-31 MED ORDER — NITROFURANTOIN MONOHYD MACRO 100 MG PO CAPS: 100.0000 mg | ORAL_CAPSULE | Freq: Two times a day (BID) | ORAL | 0 refills | Status: DC

## 2020-06-08 ENCOUNTER — Other Ambulatory Visit: Payer: Self-pay

## 2020-06-08 ENCOUNTER — Emergency Department
Admission: EM | Admit: 2020-06-08 | Discharge: 2020-06-08 | Disposition: A | Discharge status: Home / Self Care | Payer: Medicaid Other | Attending: Emergency Medicine | Admitting: Emergency Medicine

## 2020-06-08 DIAGNOSIS — K2900 Acute gastritis without bleeding: Secondary | ICD-10-CM | POA: Diagnosis not present

## 2020-06-08 DIAGNOSIS — R1012 Left upper quadrant pain: Secondary | ICD-10-CM

## 2020-06-08 DIAGNOSIS — Z87891 Personal history of nicotine dependence: Secondary | ICD-10-CM | POA: Insufficient documentation

## 2020-06-08 LAB — URINALYSIS, COMPLETE (UACMP) WITH MICROSCOPIC
Bacteria, UA: NONE SEEN
Bilirubin Urine: NEGATIVE
Glucose, UA: NEGATIVE mg/dL
Hgb urine dipstick: NEGATIVE
Ketones, ur: NEGATIVE mg/dL
Leukocytes,Ua: NEGATIVE
Nitrite: NEGATIVE
Protein, ur: NEGATIVE mg/dL
Specific Gravity, Urine: 1.024 (ref 1.005–1.030)
pH: 6 (ref 5.0–8.0)

## 2020-06-08 LAB — COMPREHENSIVE METABOLIC PANEL
ALT: 11 U/L (ref 0–44)
AST: 10 U/L — ABNORMAL LOW (ref 15–41)
Albumin: 4.6 g/dL (ref 3.5–5.0)
Alkaline Phosphatase: 40 U/L (ref 38–126)
Anion gap: 6 (ref 5–15)
BUN: 14 mg/dL (ref 6–20)
CO2: 28 mmol/L (ref 22–32)
Calcium: 9.8 mg/dL (ref 8.9–10.3)
Chloride: 105 mmol/L (ref 98–111)
Creatinine, Ser: 0.68 mg/dL (ref 0.44–1.00)
GFR, Estimated: >60 mL/min (ref >60)
Glucose, Bld: 90 mg/dL (ref 70–99)
Potassium: 4.1 mmol/L (ref 3.5–5.1)
Sodium: 139 mmol/L (ref 135–145)
Total Bilirubin: 0.5 mg/dL (ref 0.3–1.2)
Total Protein: 7.8 g/dL (ref 6.5–8.1)

## 2020-06-08 LAB — CBC
HCT: 40 % (ref 36.0–46.0)
Hemoglobin: 13.4 g/dL (ref 12.0–15.0)
MCH: 31.8 pg (ref 26.0–34.0)
MCHC: 33.5 g/dL (ref 30.0–36.0)
MCV: 95 fL (ref 80.0–100.0)
Platelets: 253 10*3/uL (ref 150–400)
RBC: 4.21 MIL/uL (ref 3.87–5.11)
RDW: 12.8 % (ref 11.5–15.5)
WBC: 7.6 10*3/uL (ref 4.0–10.5)
nRBC: 0 % (ref 0.0–0.2)

## 2020-06-08 LAB — LIPASE, BLOOD: Lipase: 32 U/L (ref 11–51)

## 2020-06-08 LAB — POC URINE PREG, ED: Preg Test, Ur: NEGATIVE

## 2020-06-08 MED ORDER — ONDANSETRON 4 MG PO TBDP: 4.0000 mg | ORAL_TABLET | Freq: Three times a day (TID) | ORAL | 0 refills | Status: DC | PRN

## 2020-06-08 MED ORDER — ONDANSETRON 4 MG PO TBDP
4.0000 mg | ORAL_TABLET | Freq: Once | ORAL | Status: AC
  Administered 2020-06-08: 4 mg via ORAL
  Filled 2020-06-08: qty 1

## 2020-06-08 MED ORDER — OMEPRAZOLE 20 MG PO CPDR: 20.0000 mg | DELAYED_RELEASE_CAPSULE | Freq: Every day | ORAL | 1 refills | Status: DC

## 2020-06-08 NOTE — ED Notes (Signed)
Patient verbalizes understanding of discharge instructions. Opportunity for questioning and answers were provided. Armband removed by staff, pt discharged from ED. Ambulated out to lobby  

## 2020-06-08 NOTE — ED Triage Notes (Signed)
Pt c/o abd pain since this morning with 2 loose stools today

## 2020-06-08 NOTE — ED Provider Notes (Signed)
[facility] Emergency Department Provider Note   ____________________________________________   Event Date/Time   First MD Initiated Contact with Patient 06/08/20 [phone]     (approximate)  I have reviewed the triage vital signs and the nursing notes.   HISTORY  Chief Complaint Abdominal Pain    HPI Vicki Black is a 30 y.o. female with past medical history of bipolar disorder who presents to the ED complaining of abdominal pain. Patient reports that she has been dealing with intermittent pain in the left upper quadrant of her abdomen for the past couple of weeks. She describes it as a sharp pain that is not exacerbated or alleviated by anything. She has been eating and drinking normally, does state that she occasionally feels nauseous but has not vomited. She denies any changes in her bowel movements, dysuria, hematuria, vaginal bleeding, or vaginal discharge. She was seen at urgent care for the symptoms and started on a PPI, but states she has not been taking this consistently. Pain recurred this morning and so she decided to seek care in the ED. She denies regular alcohol consumption or NSAID use.        Past Medical History:  Diagnosis Date  . Anxiety   . Back pain   . Bursitis of hip    trochanteric bursitis of both hips    Patient Active Problem List   Diagnosis Date Noted  . Bipolar I disorder, current or most recent episode depressed, with psychotic features (HCC) 04/25/2017  . Acute psychosis (HCC) 04/24/2017  . Amphetamine use disorder, mild (HCC) 04/24/2017  . Brief reactive psychosis (HCC) 04/24/2017  . Severe recurrent major depression without psychotic features (HCC) 01/05/2016  . Cannabis abuse 01/05/2016  . 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 DENTAL SURGERY    . TUBAL LIGATION      Prior to Admission medications   Medication Sig Start Date End Date  Taking? Authorizing Provider  omeprazole (PRILOSEC) 20 MG capsule Take 1 capsule (20 mg total) by mouth daily. 06/08/20 06/08/21 Yes Chesley Noon, MD  ondansetron (ZOFRAN ODT) 4 MG disintegrating tablet Take 1 tablet (4 mg total) by mouth every 8 (eight) hours as needed for nausea or vomiting. 06/08/20  Yes Chesley Noon, MD  famotidine (PEPCID) 20 MG tablet Take 1 tablet (20 mg total) by mouth 2 (two) times daily. 05/25/20   Domenick Gong, MD  ibuprofen (ADVIL) 600 MG tablet Take 1 tablet (600 mg total) by mouth every 6 (six) hours as needed. 05/25/20   Domenick Gong, MD  nitrofurantoin, macrocrystal-monohydrate, (MACROBID) 100 MG capsule Take 1 capsule (100 mg total) by mouth 2 (two) times daily. 05/31/20   Lamptey, Britta Mccreedy, MD  LATUDA 40 MG TABS tablet Take 1 tablet (40 mg total) by mouth at bedtime. 04/30/17 01/05/19  Pucilowska, Ellin Goodie, MD  traZODone (DESYREL) 100 MG tablet Take 1 tablet (100 mg total) by mouth at bedtime as needed for sleep. 04/30/17 01/05/19  Pucilowska, Ellin Goodie, MD    Allergies Sulfa antibiotics and Vicodin [hydrocodone-acetaminophen]  Family History  Problem Relation Age of Onset  . Cancer Mother   . Diabetes Father     Social History Social History   Tobacco Use  . Smoking status: Former Games developer  . Smokeless tobacco: Never Used  Vaping Use  . Vaping Use: Never used  Substance Use Topics  . Alcohol use: Yes    Comment: socially  .  Drug use: Yes    Types: Marijuana    Review of Systems  Constitutional: No fever/chills Eyes: No visual changes. ENT: No sore throat. Cardiovascular: Denies chest pain. Respiratory: Denies shortness of breath. Gastrointestinal: Positive for abdominal pain and nausea, no vomiting.  No diarrhea.  No constipation. Genitourinary: Negative for dysuria. Musculoskeletal: Negative for back pain. Skin: Negative for rash. Neurological: Negative for headaches, focal weakness or  numbness.  ____________________________________________   PHYSICAL EXAM:  VITAL SIGNS: ED Triage Vitals [06/08/20 0807]  Enc Vitals Group     BP 111/75     Pulse Rate 97     Resp 15     Temp 97.8 F (36.6 C)     Temp Source Oral     SpO2 100 %     Weight 124 lb (56.2 kg)     Height 5\' 5"  (1.651 m)     Head Circumference      Peak Flow      Pain Score 7     Pain Loc      Pain Edu?      Excl. in GC?     Constitutional: Alert and oriented. Eyes: Conjunctivae are normal. Head: Atraumatic. Nose: No congestion/rhinnorhea. Mouth/Throat: Mucous membranes are moist. Neck: Normal ROM Cardiovascular: Normal rate, regular rhythm. Grossly normal heart sounds. Respiratory: Normal respiratory effort.  No retractions. Lungs CTAB. Gastrointestinal: Soft and nontender. No distention. Genitourinary: deferred Musculoskeletal: No lower extremity tenderness nor edema. Neurologic:  Normal speech and language. No gross focal neurologic deficits are appreciated. Skin:  Skin is warm, dry and intact. No rash noted. Psychiatric: Mood and affect are normal. Speech and behavior are normal.  ____________________________________________   LABS (all labs ordered are listed, but only abnormal results are displayed)  Labs Reviewed  COMPREHENSIVE METABOLIC PANEL - Abnormal; Notable for the following components:      Result Value   AST 10 (*)    All other components within normal limits  URINALYSIS, COMPLETE (UACMP) WITH MICROSCOPIC - Abnormal; Notable for the following components:   Color, Urine YELLOW (*)    APPearance HAZY (*)    All other components within normal limits  LIPASE, BLOOD  CBC  POC URINE PREG, ED    PROCEDURES  Procedure(s) performed (including Critical Care):  Procedures   ____________________________________________   INITIAL IMPRESSION / ASSESSMENT AND PLAN / ED COURSE       30 year old female with past medical history of bipolar disorder who presents to the  ED complaining of intermittent left upper quadrant abdominal pain for the past couple of weeks. She has no tenderness on exam and symptoms sound most consistent with gastritis versus peptic ulcer disease. Labs are reassuring, no evidence of significant bleeding, LFTs and lipase within normal limits. Pregnancy testing is negative and UA shows no evidence of infection. We will start patient on PPI and emphasized the need for her to take this consistently. She was counseled to use Pepcid as needed on top of this and we will prescribe Zofran as needed for nausea. She was provided with GI follow-up and counseled to return to the ED for new worsening symptoms, patient agrees with plan.      ____________________________________________   FINAL CLINICAL IMPRESSION(S) / ED DIAGNOSES  Final diagnoses:  LUQ abdominal pain  Acute gastritis without hemorrhage, unspecified gastritis type     ED Discharge Orders         Ordered    omeprazole (PRILOSEC) 20 MG capsule  Daily  06/08/20 0915    ondansetron (ZOFRAN ODT) 4 MG disintegrating tablet  Every 8 hours PRN        06/08/20 0915           Note:  This document was prepared using Dragon voice recognition software and may include unintentional dictation errors.   Chesley Noon, MD 06/08/20 [phone]

## 2020-06-09 ENCOUNTER — Emergency Department
Admission: EM | Admit: 2020-06-09 | Discharge: 2020-06-09 | Disposition: A | Discharge status: Home / Self Care | Payer: Medicaid Other | Attending: Emergency Medicine | Admitting: Emergency Medicine

## 2020-06-09 ENCOUNTER — Emergency Department: Payer: Medicaid Other

## 2020-06-09 ENCOUNTER — Other Ambulatory Visit: Payer: Self-pay

## 2020-06-09 DIAGNOSIS — Z87891 Personal history of nicotine dependence: Secondary | ICD-10-CM | POA: Insufficient documentation

## 2020-06-09 DIAGNOSIS — K2970 Gastritis, unspecified, without bleeding: Secondary | ICD-10-CM

## 2020-06-09 DIAGNOSIS — K2971 Gastritis, unspecified, with bleeding: Secondary | ICD-10-CM | POA: Diagnosis not present

## 2020-06-09 DIAGNOSIS — R109 Unspecified abdominal pain: Secondary | ICD-10-CM | POA: Diagnosis present

## 2020-06-09 MED ORDER — OXYCODONE-ACETAMINOPHEN 5-325 MG PO TABS
1.0000 | ORAL_TABLET | Freq: Once | ORAL | Status: AC
  Administered 2020-06-09: 1 via ORAL
  Filled 2020-06-09: qty 1

## 2020-06-09 MED ORDER — IOHEXOL 300 MG/ML  SOLN
75.0000 mL | Freq: Once | INTRAMUSCULAR | Status: AC | PRN
  Administered 2020-06-09: 75 mL via INTRAVENOUS

## 2020-06-09 MED ORDER — FENTANYL CITRATE (PF) 100 MCG/2ML IJ SOLN
50.0000 ug | Freq: Once | INTRAMUSCULAR | Status: AC
  Administered 2020-06-09: 50 ug via INTRAVENOUS
  Filled 2020-06-09: qty 2

## 2020-06-09 MED ORDER — ONDANSETRON HCL 4 MG/2ML IJ SOLN
4.0000 mg | Freq: Once | INTRAMUSCULAR | Status: AC
  Administered 2020-06-09: 4 mg via INTRAVENOUS
  Filled 2020-06-09: qty 2

## 2020-06-09 MED ORDER — IOHEXOL 9 MG/ML PO SOLN
1000.0000 mL | Freq: Once | ORAL | Status: DC | PRN
  Administered 2020-06-09: 1000 mL via ORAL

## 2020-06-09 NOTE — ED Notes (Signed)
Provided discharge instructions. Verbalized understanding. Patient alert and oriented, ambulatory, and in no acute distress.

## 2020-06-09 NOTE — ED Triage Notes (Signed)
Pt was seen here yesterday for LUQ pain, pt states this morning she had bloody emesis. Pt is in NAD on arrival, ambulatory with a steady gait

## 2020-06-09 NOTE — ED Notes (Signed)
Patient finished drinking contrast. Radiology notified.

## 2020-06-09 NOTE — ED Notes (Signed)
See triage note. Patient here yesterday for same LUQ pain, now reports bloody emesis this morning. Patient alert and in NAD, no active vomiting.

## 2020-06-09 NOTE — ED Notes (Addendum)
Patient drinking contrast

## 2020-06-09 NOTE — ED Provider Notes (Signed)
[facility] Emergency Department Provider Note  Time seen: 9:16 AM  I have reviewed the triage vital signs and the nursing notes.   HISTORY  Chief Complaint Hematemesis   HPI Vicki Black is a 30 y.o. female with a past medical history of anxiety, bipolar, presents to the emergency department for hematemesis.  According to the patient for the past few days she has been experiencing epigastric and left upper quadrant abdominal pain.  Patient was seen in the emergency department yesterday for vomiting and pain thought to be due to gastritis.  Patient's work-up yesterday reviewed by myself is overall reassuring.  Patient states this morning she was vomiting and there was blood within the vomit so the patient return to the emergency department.  Patient states she has been missing too much work and needs a doctor's note regarding her condition.  Patient states she was discharged with medications for gastritis but has not yet filled them.  States she was discharged with GI medicine referral but is not yet called.   Past Medical History:  Diagnosis Date  . Anxiety   . Back pain   . Bursitis of hip    trochanteric bursitis of both hips    Patient Active Problem List   Diagnosis Date Noted  . Bipolar I disorder, current or most recent episode depressed, with psychotic features (HCC) 04/25/2017  . Acute psychosis (HCC) 04/24/2017  . Amphetamine use disorder, mild (HCC) 04/24/2017  . Brief reactive psychosis (HCC) 04/24/2017  . Severe recurrent major depression without psychotic features (HCC) 01/05/2016  . Cannabis abuse 01/05/2016  . 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 DENTAL SURGERY    . TUBAL LIGATION      Prior to Admission medications   Medication Sig Start Date End Date Taking? Authorizing Provider  famotidine (PEPCID) 20 MG tablet Take 1 tablet (20 mg total) by mouth 2 (two)  times daily. 05/25/20   Domenick Gong, MD  ibuprofen (ADVIL) 600 MG tablet Take 1 tablet (600 mg total) by mouth every 6 (six) hours as needed. 05/25/20   Domenick Gong, MD  nitrofurantoin, macrocrystal-monohydrate, (MACROBID) 100 MG capsule Take 1 capsule (100 mg total) by mouth 2 (two) times daily. 05/31/20   Merrilee Jansky, MD  omeprazole (PRILOSEC) 20 MG capsule Take 1 capsule (20 mg total) by mouth daily. 06/08/20 06/08/21  Chesley Noon, MD  ondansetron (ZOFRAN ODT) 4 MG disintegrating tablet Take 1 tablet (4 mg total) by mouth every 8 (eight) hours as needed for nausea or vomiting. 06/08/20   Chesley Noon, MD  LATUDA 40 MG TABS tablet Take 1 tablet (40 mg total) by mouth at bedtime. 04/30/17 01/05/19  Pucilowska, Ellin Goodie, MD  traZODone (DESYREL) 100 MG tablet Take 1 tablet (100 mg total) by mouth at bedtime as needed for sleep. 04/30/17 01/05/19  Shari Prows, MD    Allergies  Allergen Reactions  . Sulfa Antibiotics Nausea And Vomiting  . Vicodin [Hydrocodone-Acetaminophen]     Family History  Problem Relation Age of Onset  . Cancer Mother   . Diabetes Father     Social History Social History   Tobacco Use  . Smoking status: Former Games developer  . Smokeless tobacco: Never Used  Vaping Use  . Vaping Use: Never used  Substance Use Topics  . Alcohol use: Yes    Comment: socially  . Drug use: Yes    Types: Marijuana  Review of Systems Constitutional: Negative for fever. Cardiovascular: Negative for chest pain. Respiratory: Negative for shortness of breath. Gastrointestinal: upper abdominal pain, nausea vomiting.  Blood within the vomit. Genitourinary: Negative for urinary compaints Musculoskeletal: Negative for musculoskeletal complaints Neurological: Negative for headache All other ROS negative  ____________________________________________   PHYSICAL EXAM:  VITAL SIGNS: ED Triage Vitals  Enc Vitals Group     BP 06/09/20 0807 (!) 109/58     Pulse  Rate 06/09/20 0807 100     Resp 06/09/20 0807 16     Temp 06/09/20 0807 98.2 F (36.8 C)     Temp Source 06/09/20 0807 Oral     SpO2 --      Weight 06/09/20 0803 124 lb (56.2 kg)     Height 06/09/20 0803 5\' 5"  (1.651 m)     Head Circumference --      Peak Flow --      Pain Score --      Pain Loc --      Pain Edu? --      Excl. in GC? --     Constitutional: Alert and oriented. Well appearing and in no distress. Eyes: Normal exam ENT      Head: Normocephalic and atraumatic.      Mouth/Throat: Mucous membranes are moist. Cardiovascular: Normal rate, regular rhythm.  Respiratory: Normal respiratory effort without tachypnea nor retractions. Breath sounds are clear Gastrointestinal: Soft, mild epigastric tenderness palpation otherwise benign abdomen without rebound guarding or distention. Musculoskeletal: Nontender with normal range of motion in all extremities. Neurologic:  Normal speech and language. No gross focal neurologic deficits  Skin:  Skin is warm, dry and intact.  Psychiatric: Mood and affect are normal.   ____________________________________________   RADIOLOGY  CT negative  ____________________________________________   INITIAL IMPRESSION / ASSESSMENT AND PLAN / ED COURSE  Pertinent labs & imaging results that were available during my care of the patient were reviewed by me and considered in my medical decision making (see chart for details).   Patient presents emergency department for continued upper abdominal pain along with nausea vomiting.  Patient noted blood within the vomit this morning.  Overall the patient appears well, mild epigastric tenderness.  Patient symptoms are very suggestive of gastritis.  I reviewed the patient's work-up from yesterday which was reassuring.  However given the patient's continued pain now with hematemesis I discussed pros and cons of CT imaging, patient would like to proceed with CT scan.  I do not believe repeat lab work would be  of much benefit to the patient.  We will treat pain nausea, obtain CT imaging.  If CT scan is negative anticipate discharge home with similar discharge plan from yesterday including filling her prescriptions and following up with GI.  Patient agreeable to plan of care.  CT scan is negative.  We will discharge home with GI follow-up.  Patient will fill her prescriptions from yesterday.  Vicki Black was evaluated in Emergency Department on 06/09/2020 for the symptoms described in the history of present illness. She was evaluated in the context of the global COVID-19 pandemic, which necessitated consideration that the patient might be at risk for infection with the SARS-CoV-2 virus that causes COVID-19. Institutional protocols and algorithms that pertain to the evaluation of patients at risk for COVID-19 are in a state of rapid change based on information released by regulatory bodies including the CDC and federal and state organizations. These policies and algorithms were followed during the patient's care in  the ED.  ____________________________________________   FINAL CLINICAL IMPRESSION(S) / ED DIAGNOSES  Epigastric pain Gastritis   Minna Antis, MD 06/09/20 1053

## 2021-05-15 ENCOUNTER — Emergency Department
Admission: EM | Admit: 2021-05-15 | Discharge: 2021-05-15 | Disposition: A | Discharge status: Home / Self Care | Payer: Medicaid Other | Attending: Emergency Medicine | Admitting: Emergency Medicine

## 2021-05-15 ENCOUNTER — Other Ambulatory Visit: Payer: Self-pay

## 2021-05-15 ENCOUNTER — Encounter: Payer: Self-pay | Admitting: Emergency Medicine

## 2021-05-15 DIAGNOSIS — R102 Pelvic and perineal pain: Secondary | ICD-10-CM | POA: Diagnosis not present

## 2021-05-15 LAB — URINALYSIS, ROUTINE W REFLEX MICROSCOPIC
Bilirubin Urine: NEGATIVE
Glucose, UA: NEGATIVE mg/dL
Hgb urine dipstick: NEGATIVE
Ketones, ur: NEGATIVE mg/dL
Nitrite: NEGATIVE
Protein, ur: NEGATIVE mg/dL
Specific Gravity, Urine: 1.02 (ref 1.005–1.030)
pH: 5 (ref 5.0–8.0)

## 2021-05-15 LAB — WET PREP, GENITAL
Clue Cells Wet Prep HPF POC: NONE SEEN
Trich, Wet Prep: NONE SEEN
WBC, Wet Prep HPF POC: >=10 — AB (ref <10)
Yeast Wet Prep HPF POC: NONE SEEN

## 2021-05-15 LAB — CHLAMYDIA/NGC RT PCR (ARMC ONLY)
Chlamydia Tr: NOT DETECTED
N gonorrhoeae: NOT DETECTED

## 2021-05-15 LAB — POC URINE PREG, ED: Preg Test, Ur: NEGATIVE

## 2021-05-15 MED ORDER — CEFTRIAXONE SODIUM 1 G IJ SOLR
500.0000 mg | Freq: Once | INTRAMUSCULAR | Status: AC
  Administered 2021-05-15: 500 mg via INTRAMUSCULAR
  Filled 2021-05-15: qty 10

## 2021-05-15 MED ORDER — DOXYCYCLINE HYCLATE 100 MG PO CAPS: 100.0000 mg | ORAL_CAPSULE | Freq: Two times a day (BID) | ORAL | 0 refills | Status: DC

## 2021-05-15 MED ORDER — OXYCODONE-ACETAMINOPHEN 5-325 MG PO TABS: 1.0000 | ORAL_TABLET | Freq: Four times a day (QID) | ORAL | 0 refills | Status: AC | PRN

## 2021-05-15 MED ORDER — LIDOCAINE HCL (PF) 1 % IJ SOLN
5.0000 mL | Freq: Once | INTRAMUSCULAR | Status: AC
  Administered 2021-05-15: 5 mL
  Filled 2021-05-15: qty 5

## 2021-05-15 MED ORDER — OXYCODONE-ACETAMINOPHEN 5-325 MG PO TABS
1.0000 | ORAL_TABLET | Freq: Once | ORAL | Status: AC
  Administered 2021-05-15: 1 via ORAL
  Filled 2021-05-15: qty 1

## 2021-05-15 NOTE — ED Notes (Signed)
At bedside during pelvic exam with provider. Patient tolerated well

## 2021-05-15 NOTE — ED Triage Notes (Signed)
Pt via POV from home. Pt c/o pelvic pain since last night. Pt states it started after she had intercourse last night. Denies it being rough or using inanimate objects. Denies urinary symptoms. Denies NVD. Denies bleeding. Denies vaginal discharge. Pt is A&OX4 and NAD

## 2021-05-15 NOTE — ED Provider Notes (Signed)
[facility] Provider Note    Event Date/Time   First MD Initiated Contact with Patient 05/15/21 1310     (approximate)   History   Pelvic Pain   HPI  Vicki Black is a 31 y.o. female   presents to the ED with complaint of pelvic pain since last night.  Patient states that she had intercourse and the pain started after that.  She denies rough sex and has had same partner for approximately 6 months.  She denies any vaginal discharge or urinary symptoms.  Patient states that she is 8 days away from starting her period.  Patient has a history of polysubstance abuse and bipolar disorder.  Currently she rates her pain as an 8 out of 10.      Physical Exam   Triage Vital Signs: ED Triage Vitals [05/15/21 1259]  Enc Vitals Group     BP 116/81     Pulse Rate (!) 109     Resp 18     Temp 98.4 F (36.9 C)     Temp Source Oral     SpO2 99 %     Weight 130 lb (59 kg)     Height 5\' 5"  (1.651 m)     Head Circumference      Peak Flow      Pain Score 8     Pain Loc      Pain Edu?      Excl. in GC?     Most recent vital signs: Vitals:   05/15/21 1259 05/15/21 1545  BP: 116/81   Pulse: (!) 109 86  Resp: 18 (!) 179  Temp: 98.4 F (36.9 C)   SpO2: 99% 98%     General: Awake, no distress.  CV:  Good peripheral perfusion.  Resp:  Normal effort.  Abd:  No distention.  Bowel sounds normoactive x4 quadrants. Other:  Pelvic exam: External genitalia is unremarkable and no active discharge is present.  Cervix was visible and thin white mucus was present.  No evidence of trauma.  No adnexal masses or tenderness noted.  There is some minimal cervical motion tenderness present.  No drainage noted from cervical os.   ED Results / Procedures / Treatments   Labs (all labs ordered are listed, but only abnormal results are displayed) Labs Reviewed  WET PREP, GENITAL - Abnormal; Notable for the following components:      Result Value   WBC, Wet Prep  HPF POC >=10 (*)    All other components within normal limits  URINALYSIS, ROUTINE W REFLEX MICROSCOPIC - Abnormal; Notable for the following components:   Color, Urine YELLOW (*)    APPearance CLOUDY (*)    Leukocytes,Ua LARGE (*)    Bacteria, UA MANY (*)    All other components within normal limits  CHLAMYDIA/NGC RT PCR ([facility] ONLY)            URINE CULTURE  POC URINE PREG, ED      PROCEDURES:  Critical Care performed:   Procedures   MEDICATIONS ORDERED IN ED: Medications  cefTRIAXone (ROCEPHIN) injection 500 mg (500 mg Intramuscular Given 05/15/21 1532)  lidocaine (PF) (XYLOCAINE) 1 % injection 5 mL (5 mLs Infiltration Given 05/15/21 1533)  oxyCODONE-acetaminophen (PERCOCET/ROXICET) 5-325 MG per tablet 1 tablet (1 tablet Oral Given 05/15/21 1540)     IMPRESSION / MDM / ASSESSMENT AND PLAN / ED COURSE  I reviewed the triage vital signs and the nursing notes.   Differential  diagnosis includes, but is not limited to, urinary tract infection, vaginitis, cervicitis, STI.  31 year old female presents to the ED with complaint of pelvic pain after having intercourse last evening.  Patient denies any urinary symptoms or vaginal discharge.  Urine pregnancy test was negative, urinalysis showed large leukocytes with 6-10 RBCs and 21-50 WBCs with many bacteria.  Wet prep showed greater than 10 WBCs but no yeast, trichomonas or clue cells.  Sperm was present.  A urine culture was ordered and at the time of discharge patient's chlamydia and gonorrhea test were still pending.  I spoke with patient and explained that we would go ahead with protocols for STIs and that she should be able to see the results of her test later this evening on MyChart.  She is aware that if she is positive for gonorrhea or chlamydia that she was given the Rocephin IM in the ED and has a prescription for the doxycycline at her pharmacy.  Patient is to follow-up with her PCP if any continued problems however she is  strongly encouraged to return to the emergency department should she develop any fever, chills, nausea or vomiting or severe worsening of her pelvic pain.        FINAL CLINICAL IMPRESSION(S) / ED DIAGNOSES   Final diagnoses:  Pelvic pain in female     Rx / DC Orders   ED Discharge Orders          Ordered    doxycycline (VIBRAMYCIN) 100 MG capsule  2 times daily        05/15/21 1518    oxyCODONE-acetaminophen (PERCOCET) 5-325 MG tablet  Every 6 hours PRN        05/15/21 1518             Note:  This document was prepared using Dragon voice recognition software and may include unintentional dictation errors.   Tommi Rumps, PA-C 05/15/21 1547    Jene Every, MD 05/15/21 [phone]

## 2021-05-15 NOTE — Discharge Instructions (Signed)
Call make a follow-up appoint with your primary care provider if any continued problems.  You may see the results of your test on MyChart later this evening.  Be aware that if either 1 of these test are positive you have already been treated today while in the ED with the exception of picking up the doxycycline and the pharmacy.  Also prescription for oxycodone was sent to the pharmacy if needed for pain.  Be aware that this medication could cause drowsiness and increase your risk for injury.  If any severe worsening of your symptoms such as fever, chills, nausea or vomiting return to the emergency department.

## 2021-05-17 LAB — URINE CULTURE: Culture: >=100000 — AB

## 2021-11-08 ENCOUNTER — Other Ambulatory Visit: Payer: Self-pay

## 2021-11-08 ENCOUNTER — Emergency Department
Admission: EM | Admit: 2021-11-08 | Discharge: 2021-11-08 | Disposition: A | Discharge status: Home / Self Care | Payer: Medicaid Other | Attending: Emergency Medicine | Admitting: Emergency Medicine

## 2021-11-08 DIAGNOSIS — R110 Nausea: Secondary | ICD-10-CM | POA: Diagnosis not present

## 2021-11-08 DIAGNOSIS — Z8719 Personal history of other diseases of the digestive system: Secondary | ICD-10-CM | POA: Diagnosis not present

## 2021-11-08 DIAGNOSIS — R1012 Left upper quadrant pain: Secondary | ICD-10-CM | POA: Diagnosis present

## 2021-11-08 DIAGNOSIS — R140 Abdominal distension (gaseous): Secondary | ICD-10-CM | POA: Diagnosis not present

## 2021-11-08 LAB — URINALYSIS, ROUTINE W REFLEX MICROSCOPIC
Bilirubin Urine: NEGATIVE
Glucose, UA: NEGATIVE mg/dL
Hgb urine dipstick: NEGATIVE
Ketones, ur: NEGATIVE mg/dL
Leukocytes,Ua: NEGATIVE
Nitrite: NEGATIVE
Protein, ur: NEGATIVE mg/dL
Specific Gravity, Urine: 1.024 (ref 1.005–1.030)
pH: 6 (ref 5.0–8.0)

## 2021-11-08 LAB — COMPREHENSIVE METABOLIC PANEL
ALT: 12 U/L (ref 0–44)
AST: 14 U/L — ABNORMAL LOW (ref 15–41)
Albumin: 4.1 g/dL (ref 3.5–5.0)
Alkaline Phosphatase: 35 U/L — ABNORMAL LOW (ref 38–126)
Anion gap: 5 (ref 5–15)
BUN: 12 mg/dL (ref 6–20)
CO2: 27 mmol/L (ref 22–32)
Calcium: 9.1 mg/dL (ref 8.9–10.3)
Chloride: 107 mmol/L (ref 98–111)
Creatinine, Ser: 0.82 mg/dL (ref 0.44–1.00)
GFR, Estimated: >60 mL/min (ref >60)
Glucose, Bld: 93 mg/dL (ref 70–99)
Potassium: 3.9 mmol/L (ref 3.5–5.1)
Sodium: 139 mmol/L (ref 135–145)
Total Bilirubin: 0.6 mg/dL (ref 0.3–1.2)
Total Protein: 7.2 g/dL (ref 6.5–8.1)

## 2021-11-08 LAB — CBC
HCT: 39.6 % (ref 36.0–46.0)
Hemoglobin: 13.1 g/dL (ref 12.0–15.0)
MCH: 30.6 pg (ref 26.0–34.0)
MCHC: 33.1 g/dL (ref 30.0–36.0)
MCV: 92.5 fL (ref 80.0–100.0)
Platelets: 284 10*3/uL (ref 150–400)
RBC: 4.28 MIL/uL (ref 3.87–5.11)
RDW: 12.4 % (ref 11.5–15.5)
WBC: 4 10*3/uL (ref 4.0–10.5)
nRBC: 0 % (ref 0.0–0.2)

## 2021-11-08 LAB — POC URINE PREG, ED: Preg Test, Ur: NEGATIVE

## 2021-11-08 LAB — LIPASE, BLOOD: Lipase: 33 U/L (ref 11–51)

## 2021-11-08 NOTE — ED Provider Notes (Signed)
[facility] Provider Note   Event Date/Time   First MD Initiated Contact with Patient 11/08/21 1355     (approximate) History  Abdominal Pain  HPI Vicki Black is a 31 y.o. female with stated past medical history of hiatal hernia who presents for left upper abdominal pain with intermittent bloating over the last 2 days.  Patient states that she has had nausea without vomiting.  Patient states that she has had frequent stools without diarrhea.  Patient states that she has had significant bloating over the last evening that resolved spontaneously after passing flatus.  Patient states that she is extremely concerned as she was diagnosed with a hernia and believes that her hernia may be "blocked".  Patient denies any recent travel, recent sick contacts, or recent food out of the ordinary. ROS: Patient currently denies any vision changes, tinnitus, difficulty speaking, facial droop, sore throat, chest pain, shortness of breath, vomiting/diarrhea, dysuria, or weakness/numbness/paresthesias in any extremity   Physical Exam  Triage Vital Signs: ED Triage Vitals  Enc Vitals Group     BP 11/08/21 1159 116/71     Pulse Rate 11/08/21 1159 91     Resp 11/08/21 1159 17     Temp 11/08/21 1159 98.8 F (37.1 C)     Temp Source 11/08/21 1159 Oral     SpO2 11/08/21 1159 97 %     Weight 11/08/21 1200 124 lb (56.2 kg)     Height 11/08/21 1200 5\' 4"  (1.626 m)     Head Circumference --      Peak Flow --      Pain Score 11/08/21 1200 9     Pain Loc --      Pain Edu? --      Excl. in GC? --    Most recent vital signs: Vitals:   11/08/21 1159  BP: 116/71  Pulse: 91  Resp: 17  Temp: 98.8 F (37.1 C)  SpO2: 97%   General: Awake, oriented x4. CV:  Good peripheral perfusion.  Resp:  Normal effort.  Abd:  No distention.  Mild tenderness to palpation in left upper quadrant Other:  Middle-aged Caucasian female laying in bed in no acute distress ED Results / Procedures  / Treatments  Labs (all labs ordered are listed, but only abnormal results are displayed) Labs Reviewed  COMPREHENSIVE METABOLIC PANEL - Abnormal; Notable for the following components:      Result Value   AST 14 (*)    Alkaline Phosphatase 35 (*)    All other components within normal limits  URINALYSIS, ROUTINE W REFLEX MICROSCOPIC - Abnormal; Notable for the following components:   Color, Urine YELLOW (*)    APPearance HAZY (*)    All other components within normal limits  LIPASE, BLOOD  CBC  POC URINE PREG, ED   PROCEDURES: Critical Care performed: No .1-3 Lead EKG Interpretation  Performed by: 01/08/22, MD Authorized by: Merwyn Katos, MD     Interpretation: normal     ECG rate:  90   ECG rate assessment: normal     Rhythm: sinus rhythm     Ectopy: none     Conduction: normal    MEDICATIONS ORDERED IN ED: Medications - No data to display IMPRESSION / MDM / ASSESSMENT AND PLAN / ED COURSE  I reviewed the triage vital signs and the nursing notes.  Differential diagnosis includes, but is not limited to, gastroparesis, incarcerated hiatal hernia, splenic infarction The patient is on the cardiac monitor to evaluate for evidence of arrhythmia and/or significant heart rate changes. Patient's presentation is most consistent with acute presentation with potential threat to life or bodily function. Patients symptoms not typical for emergent causes of abdominal pain such as, but not limited to, appendicitis, abdominal aortic aneurysm, surgical biliary disease, pancreatitis, SBO, mesenteric ischemia, serious intra-abdominal bacterial illness. Presentation also not typical of gynecologic emergencies such as TOA, Ovarian Torsion, PID. Not Ectopic. Doubt atypical ACS.  Pt tolerating PO. Disposition: Patient will be discharged with strict return precautions and follow up with primary MD within 12-24 hours for further evaluation. Patient understands  that this still may have an early presentation of an emergent medical condition such as appendicitis that will require a recheck.   FINAL CLINICAL IMPRESSION(S) / ED DIAGNOSES   Final diagnoses:  Left upper quadrant abdominal pain  History of hiatal hernia   Rx / DC Orders   ED Discharge Orders     None      Note:  This document was prepared using Dragon voice recognition software and may include unintentional dictation errors.   Merwyn Katos, MD 11/08/21 [phone]

## 2021-11-08 NOTE — Discharge Instructions (Addendum)
Please use ibuprofen (Motrin) up to 800 mg every 8 hours, naproxen (Naprosyn) up to 500 mg every 12 hours, and/or acetaminophen (Tylenol) up to 4 g/day for any continued pain 

## 2021-11-08 NOTE — ED Notes (Signed)
Dc ppw provided, pt provides verbal consent for dc at this time.

## 2021-11-08 NOTE — ED Triage Notes (Signed)
Pt has had abdominal pain since Sunday. She has had more frequent stools but not diarrhea, pt has experienced nausea at home. Pt also states she had a hernia found last year pain feels the same.

## 2022-11-28 ENCOUNTER — Encounter: Payer: Self-pay | Admitting: Emergency Medicine

## 2022-11-28 ENCOUNTER — Ambulatory Visit
Admission: EM | Admit: 2022-11-28 | Discharge: 2022-11-28 | Disposition: A | Discharge status: Home / Self Care | Payer: MEDICAID | Attending: Family Medicine | Admitting: Family Medicine

## 2022-11-28 ENCOUNTER — Other Ambulatory Visit: Payer: Self-pay

## 2022-11-28 DIAGNOSIS — M7918 Myalgia, other site: Secondary | ICD-10-CM

## 2022-11-28 MED ORDER — DEXAMETHASONE SODIUM PHOSPHATE 10 MG/ML IJ SOLN
10.0000 mg | Freq: Once | INTRAMUSCULAR | Status: AC
Start: 2022-11-28 — End: 2022-11-28
  Administered 2022-11-28: 10 mg via INTRAMUSCULAR

## 2022-11-28 MED ORDER — KETOROLAC TROMETHAMINE 30 MG/ML IJ SOLN
30.0000 mg | Freq: Once | INTRAMUSCULAR | Status: AC
  Administered 2022-11-28: 30 mg via INTRAMUSCULAR

## 2022-11-28 MED ORDER — PREDNISONE 10 MG PO TABS: ORAL_TABLET | ORAL | 0 refills | Status: DC

## 2022-11-28 MED ORDER — PREDNISONE 20 MG PO TABS: ORAL_TABLET | ORAL | 0 refills | Status: AC

## 2022-11-28 NOTE — ED Provider Notes (Signed)
Vicki Black    CSN: [id number] Arrival date & time: 11/28/22  1628      History   Chief Complaint Chief Complaint  Patient presents with   Shoulder Pain    HPI Vicki Black is a 32 y.o. female.   Patient presents today with left neck, shoulder and pain between her shoulder blades pain exacerbated by movement of her left arm and rotational movements of her neck , nodding of the head.  Patient reports she has had similar course of pain before and was prescribed muscle relaxers which were ineffective however was prescribed prednisone with some help. On chart review patient has a history of myofascial pain syndrome. Current symptoms present x 3-4 days, however worsened today. 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      Home Medications    Prior to Admission medications   Medication Sig Start Date End Date Taking? Authorizing Provider  doxycycline (VIBRAMYCIN) 100 MG capsule Take 1 capsule (100 mg total) by mouth 2 (two) times daily. 05/15/21   Bridget Hartshorn L, PA-C  LATUDA 40 MG TABS tablet Take 1 tablet (40 mg total) by mouth at bedtime. 04/30/17 01/05/19  Pucilowska, Ellin Goodie, MD  traZODone (DESYREL) 100 MG tablet Take 1 tablet (100 mg total) by mouth at bedtime as needed for sleep. 04/30/17  01/05/19  Pucilowska, Ellin Goodie, MD    Family History Family History  Problem Relation Age of Onset   Cancer Mother    Diabetes Father     Social History Social History   Tobacco Use   Smoking status: Former   Smokeless tobacco: Never  Advertising account planner   Vaping status: Every Day   Substances: Nicotine  Substance Use Topics   Alcohol use: Yes    Comment: socially   Drug use: Not Currently    Types: Marijuana     Allergies   Sulfa antibiotics and Vicodin [hydrocodone-acetaminophen]   Review of Systems Review of Systems Pertinent negatives listed in HPI   Physical Exam Triage Vital Signs ED Triage Vitals [11/28/22 1758]  Encounter Vitals Group     BP 116/77     Systolic BP Percentile      Diastolic BP Percentile      Pulse Rate 76     Resp 18     Temp 98.6 F (37 C)     Temp Source Oral     SpO2 98 %     Weight      Height      Head Circumference      Peak Flow      Pain Score 9     Pain Loc      Pain Education      Exclude from Growth Chart  No data found.  Updated Vital Signs BP 116/77 (BP Location: Right Arm)   Pulse 76   Temp 98.6 F (37 C) (Oral)   Resp 18   SpO2 98%   Visual Acuity Right Eye Distance:   Left Eye Distance:   Bilateral Distance:    Right Eye Near:   Left Eye Near:    Bilateral Near:     Physical Exam Constitutional:      Appearance: Normal appearance.  HENT:     Head: Normocephalic and atraumatic.     Nose: No congestion or rhinorrhea.  Eyes:     Extraocular Movements: Extraocular movements intact.     Pupils: Pupils are equal, round, and reactive to light.  Neck:     Comments: Patient is stabilizing neck in position of rigidity. Cardiovascular:     Rate and Rhythm: Normal rate and regular rhythm.  Pulmonary:     Effort: Pulmonary effort is normal.     Breath sounds: Normal breath sounds.  Skin:    General: Skin is warm and dry.  Neurological:     General: No focal deficit present.     Mental Status: She is  alert.      UC Treatments / Results  Labs (all labs ordered are listed, but only abnormal results are displayed) Labs Reviewed - No data to display  EKG   Radiology No results found.  Procedures Procedures (including critical care time)  Medications Ordered in UC Medications  ketorolac (TORADOL) 30 MG/ML injection 30 mg (has no administration in time range)  dexamethasone (DECADRON) injection 10 mg (has no administration in time range)    Initial Impression / Assessment and Plan / UC Course  I have reviewed the triage vital signs and the nursing notes.  Pertinent labs & imaging results that were available during my care of the patient were reviewed by me and considered in my medical decision making (see chart for details).    Myofascial pain Decadron given here in clinic.  Patient will start prednisone tomorrow.  Patient advised to follow-up with primary care provider symptoms worsen or do not improve. Final Clinical Impressions(s) / UC Diagnoses   Final diagnoses:  Myofascial pain with referred pain   Discharge Instructions   None    ED Prescriptions     Medication Sig Dispense Auth. Provider   predniSONE (DELTASONE) 10 MG tablet  (Status: Discontinued) Take 2 tablets (20 mg total) by mouth daily with breakfast for 5 days, THEN 1 tablet (10 mg total) daily with breakfast for 2 days. 12 tablet Bing Neighbors, NP      PDMP not reviewed this encounter.   Bing Neighbors, NP 11/28/22 [phone]

## 2022-11-28 NOTE — ED Triage Notes (Signed)
Patient presents to [facility] for evaluation of two days of left pain between her shoulder blade and her spine.  Hurts much worse with movement and to touch.  Patient can't lift her arm above 2 inches from her side, turning her head and chin to chest is very painful.  Hx of similar pain, no diagnosis, but this is the worst it has ever been.  Patient requesting"the highest dose of toradol you have" and states she has been prescribed muscle relaxers in the past that didn't help, and prednisone does seem to help.

## 2023-01-29 ENCOUNTER — Emergency Department: Payer: MEDICAID

## 2023-01-29 ENCOUNTER — Emergency Department
Admission: EM | Admit: 2023-01-29 | Discharge: 2023-01-29 | Disposition: A | Discharge status: Home / Self Care | Payer: MEDICAID | Attending: Emergency Medicine | Admitting: Emergency Medicine

## 2023-01-29 ENCOUNTER — Other Ambulatory Visit: Payer: Self-pay

## 2023-01-29 DIAGNOSIS — S39012A Strain of muscle, fascia and tendon of lower back, initial encounter: Secondary | ICD-10-CM | POA: Diagnosis not present

## 2023-01-29 DIAGNOSIS — M545 Low back pain, unspecified: Secondary | ICD-10-CM | POA: Diagnosis present

## 2023-01-29 DIAGNOSIS — Y9229 Other specified public building as the place of occurrence of the external cause: Secondary | ICD-10-CM | POA: Insufficient documentation

## 2023-01-29 DIAGNOSIS — X509XXA Other and unspecified overexertion or strenuous movements or postures, initial encounter: Secondary | ICD-10-CM | POA: Insufficient documentation

## 2023-01-29 LAB — POC URINE PREG, ED: Preg Test, Ur: NEGATIVE

## 2023-01-29 MED ORDER — OXYCODONE-ACETAMINOPHEN 5-325 MG PO TABS
1.0000 | ORAL_TABLET | Freq: Once | ORAL | Status: AC
  Administered 2023-01-29: 1 via ORAL
  Filled 2023-01-29: qty 1

## 2023-01-29 MED ORDER — HYDROCODONE-ACETAMINOPHEN 5-325 MG PO TABS
1.0000 | ORAL_TABLET | Freq: Once | ORAL | Status: DC
  Filled 2023-01-29: qty 1

## 2023-01-29 MED ORDER — PREDNISONE 20 MG PO TABS
40.0000 mg | ORAL_TABLET | Freq: Every day | ORAL | 0 refills | Status: AC
Start: 2023-01-29 — End: 2023-02-03

## 2023-01-29 NOTE — Discharge Instructions (Addendum)
Take the prescription meds as directed. Follow-up with your primary provider for ongoing evaluation.

## 2023-01-29 NOTE — ED Triage Notes (Signed)
Pt states that she does have chronic back pain, had a fall last week and made her pain worse but today was the worst pain ever. [facility], she was given a toradol shot, without relief and came here.  Is c/o pain on R lower side of back with some cold/temperature sensations on her R ankle--denies sciatica pain, and a dull headache since the above fall  Denies loss of bowel and bladder control

## 2023-01-29 NOTE — ED Provider Notes (Signed)
[facility] Emergency Department Provider Note     Event Date/Time   First MD Initiated Contact with Patient 01/29/23 1911     (approximate)   History   Back Pain   HPI  Vicki Black is a 32 y.o. female with a history of chronic back pain, anxiety, presents to the ED for evaluation of acute on chronic pain.  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  She reports jarring her lower back but denies any outright fall.  Patient denies any bladder or bowel incontinence, foot drop, saddle anesthesia.  She presents today after she presented to a local urgent care for evaluation management of her symptoms.  She was given a Toradol shot there and denies any benefit or relief from the medication.  She was also prescribed tizanidine for spasm relief.  She presents here for further evaluation requesting imaging.  She would also endorse some intermittent temperature sensation changes to the right ankle.  Physical Exam   Triage Vital Signs: ED Triage Vitals  Encounter Vitals Group     BP 01/29/23 1549 (!) 102/53     Systolic BP Percentile --      Diastolic BP Percentile --      Pulse Rate 01/29/23 1549 84     Resp 01/29/23 1549 18     Temp 01/29/23 1549 97.9 F (36.6 C)     Temp Source 01/29/23 1912 Oral     SpO2 01/29/23 1549 99 %     Weight --      Height --      Head Circumference --      Peak Flow --      Pain Score 01/29/23 1554 10     Pain Loc --      Pain Education --      Exclude from Growth Chart --     Most recent vital signs: Vitals:   01/29/23 1912 01/29/23 2101  BP: (!) 98/50 (!) 103/53  Pulse: 69 73  Resp: 17 17  Temp: 98.1 F (36.7 C) 98.3 F (36.8 C)  SpO2: 100% 100%    General Awake, no distress. NAD CV:  Good peripheral perfusion.  RESP:  Normal effort.  ABD:  No distention.  MSK:  Normal spinal alignment without  midline tenderness, spasm, deformity, or step-off.  Patient localizes pain to the right SI junction.  She is able demonstrate normal lumbar flexion and extension range.  Normal hip flexion range noted. NEURO: Cranial nerves II to XII grossly intact.  Normal LE DTRs bilaterally.  Normal toe dorsiflexion and foot eversion.  Negative seated straight leg raise.  Normal toe and heel walk on exam.  No cerebellar ataxia appreciated.   ED Results / Procedures / Treatments   Labs (all labs ordered are listed, but only abnormal results are displayed) Labs Reviewed  POC URINE PREG, ED     EKG   RADIOLOGY  I personally viewed and evaluated these images as part of my medical decision making, as well as reviewing the written report by the radiologist.  ED Provider Interpretation: No acute findings  DG Lumbar Spine Complete  Result Date: 01/29/2023 CLINICAL DATA:  Low back pain with right lower extremity referral. Acute on chronic pain after fall. EXAM: LUMBAR SPINE - COMPLETE 4+ VIEW COMPARISON:  None available. FINDINGS: Diminutive ribs at L1. Minimal levo scoliotic curvature.  No listhesis. No acute fracture. The vertebral body heights are normal. Focal bone abnormality or pars defects. The disc spaces are preserved. Sacroiliac joints are congruent. IMPRESSION: 1. No acute fracture or subluxation of the lumbar spine. 2. Minimal levo scoliotic curvature. Electronically Signed   By: Narda Rutherford M.D.   On: 01/29/2023 21:43     PROCEDURES:  Critical Care performed: No  Procedures   MEDICATIONS ORDERED IN ED: Medications  oxyCODONE-acetaminophen (PERCOCET/ROXICET) 5-325 MG per tablet 1 tablet (1 tablet Oral Given 01/29/23 2215)     IMPRESSION / MDM / ASSESSMENT AND PLAN / ED COURSE  I reviewed the triage vital signs and the nursing notes.                              Differential diagnosis includes, but is not limited to, strain, sacroiliitis, lumbar radiculopathy, muscle strain,  lumbar fracture  Patient's presentation is most consistent with acute, uncomplicated illness.  Patient's diagnosis is consistent with lumbar strain.  With reassuring exam and workup at this time.  No red flags on exam.  No acute neuromuscular deficits noted.  No radiologic evidence of any acute spinal fracture or deformity based on interpretation of images.  Patient will be discharged home with prescriptions for prednisone in addition to her previously prescribed tizanidine. Patient is to follow up with her PCP as discussed, as needed or otherwise directed. Patient is given ED precautions to return to the ED for any worsening or new symptoms.  FINAL CLINICAL IMPRESSION(S) / ED DIAGNOSES   Final diagnoses:  Strain of lumbar region, initial encounter     Rx / DC Orders   ED Discharge Orders          Ordered    predniSONE (DELTASONE) 20 MG tablet  Daily with breakfast        01/29/23 2210             Note:  This document was prepared using Dragon voice recognition software and may include unintentional dictation errors.    Lissa Hoard, PA-C 01/29/23 2232    Minna Antis, MD 01/29/23 2253

## 2023-02-19 ENCOUNTER — Encounter: Payer: Self-pay | Admitting: Emergency Medicine

## 2023-02-19 ENCOUNTER — Emergency Department
Admission: EM | Admit: 2023-02-19 | Discharge: 2023-02-19 | Discharge status: Against Medical Advice | Payer: MEDICAID | Attending: Emergency Medicine | Admitting: Emergency Medicine

## 2023-02-19 DIAGNOSIS — F99 Mental disorder, not otherwise specified: Secondary | ICD-10-CM | POA: Diagnosis not present

## 2023-02-19 DIAGNOSIS — F32A Depression, unspecified: Secondary | ICD-10-CM

## 2023-02-19 DIAGNOSIS — F1090 Alcohol use, unspecified, uncomplicated: Secondary | ICD-10-CM | POA: Diagnosis present

## 2023-02-19 DIAGNOSIS — S39012A Strain of muscle, fascia and tendon of lower back, initial encounter: Secondary | ICD-10-CM | POA: Insufficient documentation

## 2023-02-19 DIAGNOSIS — X58XXXA Exposure to other specified factors, initial encounter: Secondary | ICD-10-CM | POA: Diagnosis not present

## 2023-02-19 DIAGNOSIS — F3132 Bipolar disorder, current episode depressed, moderate: Secondary | ICD-10-CM

## 2023-02-19 DIAGNOSIS — F1020 Alcohol dependence, uncomplicated: Secondary | ICD-10-CM | POA: Diagnosis not present

## 2023-02-19 DIAGNOSIS — F3130 Bipolar disorder, current episode depressed, mild or moderate severity, unspecified: Secondary | ICD-10-CM | POA: Diagnosis present

## 2023-02-19 DIAGNOSIS — Z789 Other specified health status: Secondary | ICD-10-CM

## 2023-02-19 HISTORY — DX: Bipolar disorder, unspecified: F31.9

## 2023-02-19 LAB — URINE DRUG SCREEN, QUALITATIVE (ARMC ONLY)
Amphetamines, Ur Screen: NOT DETECTED
Barbiturates, Ur Screen: NOT DETECTED
Benzodiazepine, Ur Scrn: POSITIVE — AB
Cannabinoid 50 Ng, Ur ~~LOC~~: NOT DETECTED
Cocaine Metabolite,Ur ~~LOC~~: NOT DETECTED
MDMA (Ecstasy)Ur Screen: NOT DETECTED
Methadone Scn, Ur: NOT DETECTED
Opiate, Ur Screen: NOT DETECTED
Phencyclidine (PCP) Ur S: NOT DETECTED
Tricyclic, Ur Screen: NOT DETECTED

## 2023-02-19 LAB — COMPREHENSIVE METABOLIC PANEL
ALT: 15 U/L (ref 0–44)
AST: 17 U/L (ref 15–41)
Albumin: 4.3 g/dL (ref 3.5–5.0)
Alkaline Phosphatase: 33 U/L — ABNORMAL LOW (ref 38–126)
Anion gap: 9 (ref 5–15)
BUN: 11 mg/dL (ref 6–20)
CO2: 22 mmol/L (ref 22–32)
Calcium: 8.9 mg/dL (ref 8.9–10.3)
Chloride: 106 mmol/L (ref 98–111)
Creatinine, Ser: 0.76 mg/dL (ref 0.44–1.00)
GFR, Estimated: >60 mL/min (ref >60)
Glucose, Bld: 86 mg/dL (ref 70–99)
Potassium: 3.7 mmol/L (ref 3.5–5.1)
Sodium: 137 mmol/L (ref 135–145)
Total Bilirubin: 0.5 mg/dL (ref <1.2)
Total Protein: 7.1 g/dL (ref 6.5–8.1)

## 2023-02-19 LAB — CBC
HCT: 37.8 % (ref 36.0–46.0)
Hemoglobin: 12.5 g/dL (ref 12.0–15.0)
MCH: 31.3 pg (ref 26.0–34.0)
MCHC: 33.1 g/dL (ref 30.0–36.0)
MCV: 94.7 fL (ref 80.0–100.0)
Platelets: 240 10*3/uL (ref 150–400)
RBC: 3.99 MIL/uL (ref 3.87–5.11)
RDW: 12.8 % (ref 11.5–15.5)
WBC: 6 10*3/uL (ref 4.0–10.5)
nRBC: 0 % (ref 0.0–0.2)

## 2023-02-19 LAB — ETHANOL: Alcohol, Ethyl (B): 19 mg/dL — ABNORMAL HIGH (ref <10)

## 2023-02-19 LAB — POC URINE PREG, ED: Preg Test, Ur: NEGATIVE

## 2023-02-19 LAB — ACETAMINOPHEN LEVEL: Acetaminophen (Tylenol), Serum: <10 ug/mL — ABNORMAL LOW (ref 10–30)

## 2023-02-19 LAB — SALICYLATE LEVEL: Salicylate Lvl: <7 mg/dL — ABNORMAL LOW (ref 7.0–30.0)

## 2023-02-19 MED ORDER — LAMOTRIGINE 25 MG PO TABS
250.0000 mg | ORAL_TABLET | Freq: Every day | ORAL | Status: DC
  Administered 2023-02-19: 250 mg via ORAL
  Filled 2023-02-19: qty 2

## 2023-02-19 MED ORDER — KETOROLAC TROMETHAMINE 30 MG/ML IJ SOLN
30.0000 mg | Freq: Once | INTRAMUSCULAR | Status: AC
  Administered 2023-02-19: 30 mg via INTRAMUSCULAR
  Filled 2023-02-19: qty 1

## 2023-02-19 MED ORDER — THIAMINE HCL 100 MG/ML IJ SOLN
100.0000 mg | Freq: Every day | INTRAMUSCULAR | Status: DC
  Filled 2023-02-19: qty 1

## 2023-02-19 MED ORDER — THIAMINE MONONITRATE 100 MG PO TABS: 100.0000 mg | ORAL_TABLET | Freq: Every day | ORAL | Status: DC

## 2023-02-19 MED ORDER — FOLIC ACID 1 MG PO TABS: 1.0000 mg | ORAL_TABLET | Freq: Every day | ORAL | Status: DC

## 2023-02-19 MED ORDER — NICOTINE 14 MG/24HR TD PT24
14.0000 mg | MEDICATED_PATCH | Freq: Once | TRANSDERMAL | Status: DC
  Administered 2023-02-19: 14 mg via TRANSDERMAL
  Filled 2023-02-19: qty 1

## 2023-02-19 MED ORDER — LIDOCAINE 5 % EX PTCH
1.0000 | MEDICATED_PATCH | CUTANEOUS | Status: DC
  Administered 2023-02-19: 1 via TRANSDERMAL
  Filled 2023-02-19: qty 1

## 2023-02-19 MED ORDER — ADULT MULTIVITAMIN W/MINERALS CH
1.0000 | ORAL_TABLET | Freq: Every day | ORAL | Status: DC
  Administered 2023-02-19: 1 via ORAL
  Filled 2023-02-19: qty 1

## 2023-02-19 MED ORDER — DEXAMETHASONE 6 MG PO TABS
6.0000 mg | ORAL_TABLET | Freq: Once | ORAL | Status: AC
  Administered 2023-02-19: 6 mg via ORAL
  Filled 2023-02-19: qty 1

## 2023-02-19 MED ORDER — ACETAMINOPHEN 325 MG PO TABS
650.0000 mg | ORAL_TABLET | Freq: Once | ORAL | Status: AC
  Administered 2023-02-19: 650 mg via ORAL
  Filled 2023-02-19: qty 2

## 2023-02-19 MED ORDER — LORAZEPAM 1 MG PO TABS: 1.0000 mg | ORAL_TABLET | ORAL | Status: DC | PRN

## 2023-02-19 NOTE — ED Notes (Signed)
Pt dressed out into hospital scrubs by Clinical research associate and [employer] [place]. Pt calm and cooperative. Belongings were placed in bag and labeled with patient label. Belongings are:  Black hoodie  Black long-sleeve  Black tank top Sports bra Red pants Black and white underwear Grey socks Black leggings [employer]

## 2023-02-19 NOTE — BH Assessment (Signed)
Comprehensive Clinical Assessment (CCA) Screening, Triage and Referral Note  02/19/2023 Vicki Black [id number]  [place], 32 year old female who presents to [facility] ED involuntarily for treatment. Per triage note, Pt POV to ED for request of VOL commitment due to Bipolar medication needing changed or increased as well as pt using alcohol to suppress emotions. Pt calm and cooperative in triage. Pt denies SI and HI.   During TTS assessment pt presents alert and oriented x 4, restless but cooperative, and mood-congruent with affect. The pt does not appear to be responding to internal or external stimuli. Neither is the pt presenting with any delusional thinking. Pt verified the information provided to triage RN.   Pt identifies her main complaint to be that she needs her meds adjusted. Patient reports she is unable to focus, has scattered thoughts, difficulty staying on topic, and increased anxiety. Patient reports she lives with her 3 daughters and her daughter's grandmother. Patient states she received her 2nd DUI on November 6th and was advised by her lawyer to go to a 30-day rehab. Patient states she does not believe she has an issue with drinking and declined the offer because she needs to return to work. "I am the only one working and I have to take care of my family." Patient denies using any other illicit substances. Pt reports INPT hx in 2019 with bipolar symptoms and is currently being followed by Dr. Janeece Riggers. Pt denies current SI/HI/AH/VH. Pt contracts for safety.    Per Asher Muir, NP, pt is able to discharge on Tuesday 11/19 unless she regresses or has a side effect from her medication change.   Chief Complaint:  Chief Complaint  Patient presents with   Mental Health Problem   Visit Diagnosis: Bipolar affective disorder, depressed, moderate  Patient Reported Information How did you hear about [place]? Self  What Is the Reason for Your Visit/Call Today? Medication adjustment  How Long  Has This Been Causing You Problems? 1 wk - 1 month  What Do You Feel Would Help You the Most Today? Medication(s)   Have You Recently Had Any Thoughts About Hurting Yourself? No  Are You Planning to Commit Suicide/Harm Yourself At This time? No   Have you Recently Had Thoughts About Hurting Someone Karolee Ohs? No  Are You Planning to Harm Someone at This Time? No  Explanation: No data recorded  Have You Used Any Alcohol or Drugs in the Past 24 Hours? Yes  How Long Ago Did You Use Drugs or Alcohol? No data recorded What Did You Use and How Much? Alcohol   Do You Currently Have a Therapist/Psychiatrist? Yes  Name of Therapist/Psychiatrist: Dr. Janeece Riggers   Have You Been Recently Discharged From Any Office Practice or Programs? No  Explanation of Discharge From Practice/Program: No data recorded   CCA Screening Triage Referral Assessment Type of Contact: Face-to-Face  Telemedicine Service Delivery:   Is this Initial or Reassessment?   Date Telepsych consult ordered in [facility]:    Time Telepsych consult ordered in [facility]:    Location of Assessment: [facility] ED  Provider Location: [facility] ED    Collateral Involvement: None provided   Does Patient Have a Court Appointed Legal Guardian? No data recorded Name and Contact of Legal Guardian: No data recorded If Minor and Not Living with Parent(s), Who has Custody? No data recorded Is CPS involved or ever been involved? No data recorded Is APS involved or ever been involved? Never   Patient Determined To Be At Risk  for Harm To Self or Others Based on Review of Patient Reported Information or Presenting Complaint? No  Method: No Plan  Availability of Means: No access or NA  Intent: Vague intent or NA  Notification Required: No need or identified person  Additional Information for Danger to Others Potential: No data recorded Additional Comments for Danger to Others Potential: No data recorded Are There Guns or Other Weapons in Your Home?  No  Types of Guns/Weapons: No data recorded Are These Weapons Safely Secured?                            No data recorded Who Could Verify You Are Able To Have These Secured: No data recorded Do You Have any Outstanding Charges, Pending Court Dates, Parole/Probation? No data recorded Contacted To Inform of Risk of Harm To Self or Others: No data recorded  Does Patient Present under Involuntary Commitment? No    [place] of Residence: [place]   Patient Currently Receiving the Following Services: Medication Management; Individual Therapy   Determination of Need: Emergent (2 hours)   Options For Referral: ED Visit; Medication Management; Therapeutic Triage Services   Discharge Disposition:     Clerance Lav, Counselor, LCAS-A

## 2023-02-19 NOTE — ED Notes (Signed)
Pt requesting to sigh self out. Dr. Fuller Plan and Celso Amy, NP notified. Pt signed paper AMA form and it is in chart. Pt given her belongings and is changing at this time.

## 2023-02-19 NOTE — ED Notes (Signed)
Pt was given a Snack and grapejuice

## 2023-02-19 NOTE — ED Notes (Signed)
EDT asked pt if they wanted to shower this morning pt stated NO because she wants to changed into her own clothes. Pt stated all she wants is the phone at 9am so she can make sure her kids are ok.

## 2023-02-19 NOTE — ED Triage Notes (Signed)
Pt POV to ED for request of VOL commitment due to BiPolar medication needing changed or increased as well as pt using alcohol to suppress emotions. Pt calm and cooperative in triage. Pt denies SI and HI.

## 2023-02-19 NOTE — ED Notes (Signed)
PT VOL/PENDING PSYCH CONSULT. 

## 2023-02-19 NOTE — ED Provider Notes (Signed)
[facility] Provider Note    Event Date/Time   First MD Initiated Contact with Patient 02/19/23 [phone]     (approximate)   History   Mental Health Problem   HPI  Vicki Black is a 32 y.o. female   Past medical history of bipolar and anxiety who presents to the emergency department requesting psychiatric evaluation.  Asking for bipolar medication changes, feeling depressed, anxious, and using alcohol to suppress her emotions.  She denies SI HI or audiovisual hallucination.  Her last alcohol was just prior to arrival.  She also has a lumbar back strain for which she is requesting medication.  No urinary symptoms.  No motor or sensory changes.  No incontinence.   External Medical Documents Reviewed: Emergency department visit dated 01/29/2023 noted to have chronic back pain.  Exacerbation of chronic back pain medicate and discharge.      Physical Exam   Triage Vital Signs: ED Triage Vitals  Encounter Vitals Group     BP 02/19/23 0457 106/69     Systolic BP Percentile --      Diastolic BP Percentile --      Pulse Rate 02/19/23 0457 92     Resp 02/19/23 0457 16     Temp 02/19/23 0457 98.8 F (37.1 C)     Temp Source 02/19/23 0457 Oral     SpO2 02/19/23 0457 99 %     Weight --      Height --      Head Circumference --      Peak Flow --      Pain Score 02/19/23 0409 0     Pain Loc --      Pain Education --      Exclude from Growth Chart --     Most recent vital signs: Vitals:   02/19/23 0457 02/19/23 0812  BP: 106/69 (!) 104/59  Pulse: 92 85  Resp: 16 17  Temp: 98.8 F (37.1 C) 97.8 F (36.6 C)  SpO2: 99% 99%    General: Awake, no distress.  CV:  Good peripheral perfusion.  Resp:  Normal effort.  Abd:  No distention.  Other:  Ambulatory with steady gait.  Bilateral paraspinal lumbar tenderness to palpation.  Does not appear to be acutely intoxicated normal withdrawal.   ED Results / Procedures / Treatments   Labs (all  labs ordered are listed, but only abnormal results are displayed) Labs Reviewed  COMPREHENSIVE METABOLIC PANEL - Abnormal; Notable for the following components:      Result Value   Alkaline Phosphatase 33 (*)    All other components within normal limits  ETHANOL - Abnormal; Notable for the following components:   Alcohol, Ethyl (B) 19 (*)    All other components within normal limits  ACETAMINOPHEN LEVEL - Abnormal; Notable for the following components:   Acetaminophen (Tylenol), Serum <10 (*)    All other components within normal limits  SALICYLATE LEVEL - Abnormal; Notable for the following components:   Salicylate Lvl <7.0 (*)    All other components within normal limits  CBC  URINE DRUG SCREEN, QUALITATIVE ([facility] ONLY)  POC URINE PREG, ED     I ordered and reviewed the above labs they are notable for cell counts electrolytes largely unremarkable.  PROCEDURES:  Critical Care performed: No  Procedures   MEDICATIONS ORDERED IN ED: Medications  LORazepam (ATIVAN) tablet 1-4 mg (has no administration in time range)  thiamine (VITAMIN B1) tablet 100 mg (has no  administration in time range)    Or  thiamine (VITAMIN B1) injection 100 mg (has no administration in time range)  folic acid (FOLVITE) tablet 1 mg (has no administration in time range)  multivitamin with minerals tablet 1 tablet (has no administration in time range)  lidocaine (LIDODERM) 5 % 1 patch (1 patch Transdermal Patch Applied 02/19/23 0727)  ketorolac (TORADOL) 30 MG/ML injection 30 mg (30 mg Intramuscular Given 02/19/23 0726)  acetaminophen (TYLENOL) tablet 650 mg (650 mg Oral Given 02/19/23 0725)  dexamethasone (DECADRON) tablet 6 mg (6 mg Oral Given 02/19/23 0727)     IMPRESSION / MDM / ASSESSMENT AND PLAN / ED COURSE  I reviewed the triage vital signs and the nursing notes.                                Patient's presentation is most consistent with acute presentation with potential threat to life  or bodily function.  Differential diagnosis includes, but is not limited to, lumbar strain, depression, alcohol use   The patient is on the cardiac monitor to evaluate for evidence of arrhythmia and/or significant heart rate changes.  MDM:    Patient with depression, alcohol use, requesting psychiatric evaluation here voluntarily.  Basic labs and toxicologic labs ordered and reviewed unremarkable, cleared for psychiatric evaluation voluntary.  Lumbar strain medicated.  Doubt fracture dislocation or cord compression.  Medically cleared.   No signs of withdrawal at this time but given her alcohol use history put on CIWA protocol.     FINAL CLINICAL IMPRESSION(S) / ED DIAGNOSES   Final diagnoses:  Depression, unspecified depression type  Alcohol use  Lumbar strain, initial encounter     Rx / DC Orders   ED Discharge Orders     None        Note:  This document was prepared using Dragon voice recognition software and may include unintentional dictation errors.    Pilar Jarvis, MD 02/19/23 [phone]

## 2023-02-19 NOTE — ED Notes (Signed)
Breakfast given to pt
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
## 2023-02-19 NOTE — ED Notes (Signed)
Pt with visitor in [place].

## 2023-05-29 ENCOUNTER — Encounter: Payer: Self-pay | Admitting: Obstetrics

## 2023-05-29 ENCOUNTER — Ambulatory Visit (INDEPENDENT_AMBULATORY_CARE_PROVIDER_SITE_OTHER): Payer: MEDICAID | Admitting: Obstetrics

## 2023-05-29 VITALS — BP 92/60 | HR 91 | Ht 64.0 in | Wt 116.0 lb

## 2023-05-29 DIAGNOSIS — N6452 Nipple discharge: Secondary | ICD-10-CM

## 2023-05-29 DIAGNOSIS — Z01419 Encounter for gynecological examination (general) (routine) without abnormal findings: Secondary | ICD-10-CM

## 2023-05-29 DIAGNOSIS — N898 Other specified noninflammatory disorders of vagina: Secondary | ICD-10-CM | POA: Diagnosis not present

## 2023-05-29 DIAGNOSIS — Z01411 Encounter for gynecological examination (general) (routine) with abnormal findings: Secondary | ICD-10-CM | POA: Diagnosis not present

## 2023-05-29 DIAGNOSIS — N6323 Unspecified lump in the left breast, lower outer quadrant: Secondary | ICD-10-CM | POA: Diagnosis not present

## 2023-05-29 DIAGNOSIS — Z3202 Encounter for pregnancy test, result negative: Secondary | ICD-10-CM | POA: Diagnosis not present

## 2023-05-29 DIAGNOSIS — N6313 Unspecified lump in the right breast, lower outer quadrant: Secondary | ICD-10-CM | POA: Diagnosis not present

## 2023-05-29 LAB — POCT URINE PREGNANCY: Preg Test, Ur: NEGATIVE

## 2023-05-29 NOTE — Progress Notes (Signed)
 GYNECOLOGY: ANNUAL EXAM   Subjective:    PCP: [facility] Vicki Black is a 33 y.o. female [phone] who presents for annual wellness visit.   Well Woman Visit:  GYN HISTORY:  Patient's last menstrual period was 05/18/2023.     Menstrual History: OB History     Gravida  3   Para  3   Term  3   Preterm      AB      Living  3      SAB      IAB      Ectopic      Multiple      Live Births              Menarche age: 70 Patient's last menstrual period was 05/18/2023. Period Duration (Days): 5 Period Pattern: (!) Irregular Menstrual Flow: Light Menstrual Control: Thin pad, Panty liner Menstrual Control Change Freq (Hours): 5 Dysmenorrhea: (!) Severe Dysmenorrhea Symptoms: Cramping   Intermenstrual bleeding, spotting, or discharge? yes Urinary incontinence? no  Sexually active: yes  Number of sexual partners: 1  Gender of sexual Partners: male Social History   Substance and Sexual Activity  Sexual Activity Yes   Birth control/protection: Surgical   Contraceptive methods: bilateral tubal ligation Dyspareunia? sometimes STI history: yes STI/HIV testing or immunizations needed? Yes.     Health Maintenance: -Last pap: was normal 08/03/22 NILM HPV NEG  --> Any abnormals: no  FMH of Breast / Colon / Cervical cancer: Yes Breast Great Aunt -Vaccines:  There is no immunization history on file for this patient. Last Tdap: utd / Flu: declined / COVID: declined / Gardasil: unsure / Shingles (50+): n/a / PCV20: n/a -Hep C screen: utd -Last lipid / glucose screening: pcp  > Exercise: none, not active > Dietary Supplements: Folate: No;  Calcium: No}; Vitamin D: No > Body mass index is 19.91 kg/m.  > Recent dental visit No. > Seat Belt Use: Yes.   > Texting and driving? No. > Guns in the house No. > Recreational or other drug use: denied.   Social History   Tobacco Use   Smoking status: Former   Smokeless tobacco: Never   Substance Use Topics   Alcohol use: Yes    Comment: socially   Occupation: not working     Lives with: daughters    _________________________________________________________  Current Outpatient Medications  Medication Sig Dispense Refill   diazepam (VALIUM) 5 MG tablet Take 5 mg by mouth 3 (three) times daily as needed for anxiety.     lamoTRIgine (LAMICTAL) 200 MG tablet Take 200 mg by mouth daily.     vitamin B-12 (CYANOCOBALAMIN) 100 MCG tablet Take 100 mcg by mouth daily.     No current facility-administered medications for this visit.   Allergies  Allergen Reactions   Sulfa Antibiotics Nausea And Vomiting   Vicodin [Hydrocodone-Acetaminophen] Other (See Comments)    Past Medical History:  Diagnosis Date   Anxiety    Back pain    Bipolar 1 disorder (HCC)    Bursitis of hip    trochanteric bursitis of both hips   Past Surgical History:  Procedure Laterality Date   DENTAL SURGERY     TUBAL LIGATION      Review Of Systems  Constitutional: Denied constitutional symptoms, night sweats, recent illness, fatigue, fever, insomnia and weight loss.  Eyes: Denied eye symptoms, eye pain, photophobia, vision change and visual disturbance.  Ears/Nose/Throat/Neck: Denied ear, nose, throat or  neck symptoms, hearing loss, nasal discharge, sinus congestion and sore throat.  Cardiovascular: Denied cardiovascular symptoms, arrhythmia, chest pain/pressure, edema, exercise intolerance, orthopnea and palpitations.  Respiratory: Denied pulmonary symptoms, asthma, pleuritic pain, productive sputum, cough, dyspnea and wheezing.  Gastrointestinal: Denied, gastro-esophageal reflux, melena, nausea and vomiting.  Genitourinary: Vaginal cyst  Musculoskeletal: Denied musculoskeletal symptoms, stiffness, swelling, muscle weakness and myalgia.  Dermatologic: Denied dermatology symptoms, rash and scar.  Neurologic: Denied neurology symptoms, dizziness, headache, neck pain and syncope.  Psychiatric:  Denied psychiatric symptoms, anxiety and depression.  Endocrine: Denied endocrine symptoms including hot flashes and night sweats.      Objective:    BP 92/60   Pulse 91   Ht 5\' 4"  (1.626 m)   Wt 116 lb (52.6 kg)   LMP 05/18/2023   BMI 19.91 kg/m   Constitutional: Well-developed, well-nourished female in no acute distress Neurological: Alert and oriented to person, place, and time Psychiatric: Mood and affect appropriate Skin: No rashes or lesions Neck: Supple without masses. Trachea is midline.Thyroid is normal size without masses Lymphatics: No cervical, axillary, supraclavicular, or inguinal adenopathy noted Respiratory: Clear to auscultation bilaterally. Good air movement with normal work of breathing. Cardiovascular: Regular rate and rhythm. Extremities grossly normal, nontender with no edema; pulses regular Gastrointestinal: Soft, nontender, nondistended. No masses or hernias appreciated. No hepatosplenomegaly. No fluid wave. No rebound or guarding. Breast Exam: Inspection negative, No nipple retraction or dimpling, No nipple discharge or bleeding, No axillary or supraclavicular adenopathy, Normal to palpation without dominant masses, positive findings: fibrocystic changes Genitourinary:         External Genitalia: Normal female genitalia    Vagina: Normal mucosa, no lesions. 1.5cm cyst on left wall, see image    Cervix: No lesions, normal size and consistency; no cervical motion tenderness; non-friable; Pap notobtained.    Uterus: Normal size and contour; smooth, mobile, NT,. Adnexae: Non-palpable and non-tender Perineum/Anus: No lesions Rectal: deferred    Assessment/Plan:    Vicki Black is a 33 y.o. female [phone] with normal well-woman gynecologic exam.  -Screenings:  Pap: due 08/2027 Labs: per PCP -Contraception: s/p BTL -Vaccines: UTD -Healthy lifestyle modifications discussed: multivitamin, diet, exercise, sunscreen, tobacco and alcohol use. Emphasized  importance of regular physical activity.  -Folate recommendation reviewed.  -UPT negative, done at pt's request without any symptoms, is s/p BTL  Vaginal cyst:  -Pt insistent based on [employer] that she has Bartholin's cyst, reassurance this is not. Desires removal due to discomfort, suspect is benign mucoid cyst -RTC when able for removal  Breast lumps and R nipple discharge: -Right breast with cystic lumps and subjective green discharge, not able to express today; left breast with cystic lumps. Reassurance that exam feels normal for her age and suspect she has benign cysts, but will image to rule out malignancy.  -Bilat mammogram and [place] ordered   RTC for vaginal cyst removal when able, 1 yr for annual, sooner prn.   Julieanne Manson, DO [facility] at [facility]

## 2023-06-18 ENCOUNTER — Ambulatory Visit: Payer: MEDICAID | Admitting: Obstetrics

## 2023-07-31 ENCOUNTER — Other Ambulatory Visit: Payer: Self-pay | Admitting: Otolaryngology

## 2023-08-09 ENCOUNTER — Encounter: Payer: Self-pay | Admitting: Otolaryngology

## 2023-08-13 NOTE — Discharge Instructions (Signed)
T & A INSTRUCTION SHEET - [facility]  CREIGHTON VAUGHT, MD   INFORMATION SHEET FOR A TONSILLECTOMY AND ADENDOIDECTOMY  About Your Tonsils and Adenoids  The tonsils and adenoids are normal body tissues that are part of our immune system.  They normally help to protect us against diseases that may enter our mouth and nose. However, sometimes the tonsils and/or adenoids become too large and obstruct our breathing, especially at night.    If either of these things happen it helps to remove the tonsils and adenoids in order to become healthier. The operation to remove the tonsils and adenoids is called a tonsillectomy and adenoidectomy.  The Location of Your Tonsils and Adenoids  The tonsils are located in the back of the throat on both side and sit in a cradle of muscles. The adenoids are located in the roof of the mouth, behind the nose, and closely associated with the opening of the Eustachian tube to the ear.  Surgery on Tonsils and Adenoids  A tonsillectomy and adenoidectomy is a short operation which takes about thirty minutes.  This includes being put to sleep and being awakened. Tonsillectomies and adenoidectomies are performed at [facility] and may require observation period in the recovery room prior to going home. Children are required to remain in recovery for at least 45 minutes.   Following the Operation for a Tonsillectomy  A cautery machine is used to control bleeding. Bleeding from a tonsillectomy and adenoidectomy is minimal and postoperatively the risk of bleeding is approximately four percent, although this rarely life threatening.  After your tonsillectomy and adenoidectomy post-op care at home: 1. Our patients are able to go home the same day. You may be given prescriptions for pain medications, if indicated. 2. It is extremely important to remember that fluid intake is of utmost importance after a tonsillectomy. The  amount that you drink must be maintained in the postoperative period. A good indication of whether a child is getting enough fluid is whether his/her urine output is constant. As long as children are urinating or wetting their diaper every 6 - 8 hours this is usually enough fluid intake.   3. Although rare, this is a risk of some bleeding in the first ten days after surgery. This usually occurs between day five and nine postoperatively. This risk of bleeding is approximately four percent. If you or your child should have any bleeding you should remain calm and notify our office or go directly to the emergency room at [facility] where they will contact us. Our doctors are available seven days a week for notification. We recommend sitting up quietly in a chair, place an ice pack on the front of the neck and spitting out the blood gently until we are able to contact you. Adults should gargle gently with ice water and this may help stop the bleeding. If the bleeding does not stop after a short time, i.e. 10 to 15 minutes, or seems to be increasing again, please contact us or go to the hospital.   4. It is common for the pain to be worse at 5 - 7 days postoperatively. This occurs because the "scab" is peeling off and the mucous membrane (skin of the throat) is growing back where the tonsils were.   5. It is common for a low-grade fever, less than 102, during the first week after a tonsillectomy and adenoidectomy. It is usually due to not   drinking enough liquids, and we suggest your use liquid Tylenol (acetaminophen) or the pain medicine with Tylenol (acetaminophen) prescribed in order to keep your temperature below 102. Please follow the directions on the back of the bottle. 6. Recommendations for post-operative pain in children and adults: a) For Children 12 and younger: Recommendations are for oral Tylenol (acetaminophen) and oral Motrin (Ibuprofen) along with a prescription dose of  Prednisolone which is a steroid to help with pain and swelling. Administer the Tylenol (acetaminophen) and Motrin as stated on bottle for patient's age/weight. Sometimes it may be necessary to alternate the Tylenol (acetaminophen) and Motrin for improved pain control. Motrin does last slightly longer so many patients benefit from being given this prior to bedtime. All children should avoid Aspirin products for 2 weeks following surgery. b) For children over the age of 12: Tylenol (acetaminophen) is the preferred first choice for pain control. Depending on your child's size, sometimes they will be given a combination of Tylenol (acetaminophen) and hydrocodone medication or sometimes it will be recommended they take Motrin (ibuprofen) in addition to the Tylenol (acetaminophen). Narcotics should always be used with caution in children following surgery as they can suppress their breathing and switching to over the counter Tylenol (acetaminophen) and Motrin (ibuprofen) as soon as possible is recommended. All patients should avoid Aspirin products for 2 weeks following surgery. c) Adults: Usually adults will require a narcotic pain medication following a tonsillectomy. This usually has either hydrocodone or oxycodone in it and can usually be taken every 4 to 6 hours as needed for moderate pain. If the medication does not have Tylenol (acetaminophen) in it, you may also supplement Tylenol (acetaminophen) as needed every 4 to 6 hours for breakthrough or mild pain. Adults are also given Viscous Lidocaine to swish and spit every 6 hours to help with topical pain. Adults should avoid Aspirin, Aleve, Motrin, and Ibuprofen products for 2 weeks following surgery as they can increase your risk of bleeding. 7. If you happen to look in the mirror or into your child's mouth you will see white/gray patches on the back of the throat. This is what a scab looks like in the mouth and is normal after having a tonsillectomy and  adenoidectomy. They will disappear once the tonsil areas heal completely. However, it may cause a noticeable odor, and this too will disappear with time.     8. You or your child may experience ear pain after having a tonsillectomy and adenoidectomy.  This is called referred pain and comes from the throat, but it is felt in the ears.  Ear pain is quite common and expected. It will usually go away after ten days. There is usually nothing wrong with the ears, and it is primarily due to the healing area stimulating the nerve to the ear that runs along the side of the throat. Use either the prescribed pain medicine or Tylenol (acetaminophen) as needed.  9. The throat tissues after a tonsillectomy are obviously sensitive. Smoking around children who have had a tonsillectomy significantly increases the risk of bleeding. DO NOT SMOKE!  What to Expect Each Day  First Day at Home 1. Patients will be discharged home the same day.  2. Drink at least four glasses of liquid a day. Clear, cool liquids are recommended. Fruit juices containing citric acid are not recommended because they tend to cause pain. Carbonated beverages are allowed if you pour them from glass to glass to remove the bubbles as these tend to cause   discomfort. Avoid alcoholic beverages.  3. Eat very soft foods such as soups, broth, jello, custard, pudding, ice cream, popsicles, applesauce, mashed potatoes, and in general anything that you can crush between your tongue and the roof of your mouth. Try adding Carnation Instant Breakfast Mix into your food for extra calories. It is not uncommon to lose 5 to 10 pounds of fluid weight. The weight will be gained back quickly once you're feeling better and drinking more.  4. Sleep with your head elevated on two pillows for about three days to help decrease the swelling.  5. DO NOT SMOKE!  Day Two  1. Rest as much as possible. Use common sense in your activities.  2. Continue drinking at least four glasses  of liquid per day.  3. Follow the soft diet.  4. Use your pain medication as needed.  Day Three  1. Advance your activity as you are able and continue to follow the previous day's suggestions.  Days Four Through Six  1. Advance your diet and begin to eat more solid foods such as chopped hamburger. 2. Advance your activities slowly. Children should be kept mostly around the house.  3. Not uncommonly, there will be more pain at this time. It is temporary, usually lasting a day or two.  Day Seven Through Ten  1. Most individuals by this time are able to return to work or school unless otherwise instructed. Consider sending children back to school for a half day on the first day back. 

## 2023-08-29 ENCOUNTER — Encounter
Admission: RE | Disposition: A | Discharge status: Home / Self Care | Payer: Self-pay | Source: Home / Self Care | Attending: Otolaryngology

## 2023-08-29 ENCOUNTER — Ambulatory Visit: Payer: MEDICAID | Admitting: Anesthesiology

## 2023-08-29 ENCOUNTER — Ambulatory Visit
Admission: RE | Admit: 2023-08-29 | Discharge: 2023-08-29 | Disposition: A | Discharge status: Home / Self Care | Payer: MEDICAID | Attending: Otolaryngology | Admitting: Otolaryngology

## 2023-08-29 ENCOUNTER — Other Ambulatory Visit: Payer: Self-pay

## 2023-08-29 ENCOUNTER — Encounter: Payer: Self-pay | Admitting: Otolaryngology

## 2023-08-29 DIAGNOSIS — F419 Anxiety disorder, unspecified: Secondary | ICD-10-CM | POA: Diagnosis not present

## 2023-08-29 DIAGNOSIS — Z87891 Personal history of nicotine dependence: Secondary | ICD-10-CM | POA: Insufficient documentation

## 2023-08-29 DIAGNOSIS — J358 Other chronic diseases of tonsils and adenoids: Secondary | ICD-10-CM | POA: Diagnosis not present

## 2023-08-29 DIAGNOSIS — F319 Bipolar disorder, unspecified: Secondary | ICD-10-CM | POA: Diagnosis not present

## 2023-08-29 DIAGNOSIS — J3501 Chronic tonsillitis: Secondary | ICD-10-CM | POA: Insufficient documentation

## 2023-08-29 HISTORY — PX: TONSILLECTOMY: SHX5217

## 2023-08-29 LAB — POCT PREGNANCY, URINE: Preg Test, Ur: NEGATIVE

## 2023-08-29 SURGERY — TONSILLECTOMY
Anesthesia: General | Site: Mouth | Laterality: Bilateral

## 2023-08-29 MED ORDER — SUCCINYLCHOLINE CHLORIDE 200 MG/10ML IV SOSY
PREFILLED_SYRINGE | INTRAVENOUS | Status: AC
  Filled 2023-08-29: qty 10

## 2023-08-29 MED ORDER — FENTANYL CITRATE PF 50 MCG/ML IJ SOSY: 50.0000 ug | PREFILLED_SYRINGE | INTRAMUSCULAR | Status: DC | PRN

## 2023-08-29 MED ORDER — ROCURONIUM BROMIDE 100 MG/10ML IV SOLN
INTRAVENOUS | Status: DC | PRN
Start: 2023-08-29 — End: 2023-08-29
  Administered 2023-08-29: 10 mg via INTRAVENOUS

## 2023-08-29 MED ORDER — ONDANSETRON HCL 4 MG/2ML IJ SOLN
INTRAMUSCULAR | Status: AC
  Filled 2023-08-29: qty 2

## 2023-08-29 MED ORDER — MIDAZOLAM HCL 2 MG/2ML IJ SOLN
2.0000 mg | Freq: Once | INTRAMUSCULAR | Status: AC
  Administered 2023-08-29: 2 mg via INTRAVENOUS

## 2023-08-29 MED ORDER — OXYCODONE HCL 5 MG/5ML PO SOLN
ORAL | Status: AC
  Filled 2023-08-29: qty 5

## 2023-08-29 MED ORDER — PROPOFOL 10 MG/ML IV BOLUS
INTRAVENOUS | Status: AC
  Filled 2023-08-29: qty 20

## 2023-08-29 MED ORDER — FENTANYL CITRATE (PF) 100 MCG/2ML IJ SOLN
INTRAMUSCULAR | Status: DC | PRN
  Administered 2023-08-29: 100 ug via INTRAVENOUS

## 2023-08-29 MED ORDER — OXYCODONE HCL 5 MG/5ML PO SOLN
10.0000 mg | Freq: Once | ORAL | Status: AC
  Administered 2023-08-29: 10 mg via ORAL

## 2023-08-29 MED ORDER — MIDAZOLAM HCL 2 MG/2ML IJ SOLN
INTRAMUSCULAR | Status: AC
  Filled 2023-08-29: qty 2

## 2023-08-29 MED ORDER — OXYCODONE HCL 5 MG/5ML PO SOLN
ORAL | Status: AC
  Filled 2023-08-29: qty 10

## 2023-08-29 MED ORDER — DEXMEDETOMIDINE HCL IN NACL 80 MCG/20ML IV SOLN
INTRAVENOUS | Status: AC
  Filled 2023-08-29: qty 20

## 2023-08-29 MED ORDER — LIDOCAINE HCL (CARDIAC) PF 100 MG/5ML IV SOSY
PREFILLED_SYRINGE | INTRAVENOUS | Status: DC | PRN
  Administered 2023-08-29: 80 mg via INTRAVENOUS

## 2023-08-29 MED ORDER — OXYCODONE HCL 5 MG/5ML PO SOLN: 5.0000 mg | Freq: Four times a day (QID) | ORAL | 0 refills | Status: AC | PRN

## 2023-08-29 MED ORDER — ROCURONIUM BROMIDE 10 MG/ML (PF) SYRINGE
PREFILLED_SYRINGE | INTRAVENOUS | Status: AC
  Filled 2023-08-29: qty 10

## 2023-08-29 MED ORDER — LIDOCAINE HCL (PF) 2 % IJ SOLN
INTRAMUSCULAR | Status: AC
  Filled 2023-08-29: qty 5

## 2023-08-29 MED ORDER — DEXMEDETOMIDINE HCL IN NACL 200 MCG/50ML IV SOLN
INTRAVENOUS | Status: DC | PRN
  Administered 2023-08-29: 8 ug via INTRAVENOUS

## 2023-08-29 MED ORDER — SUCCINYLCHOLINE CHLORIDE 200 MG/10ML IV SOSY
PREFILLED_SYRINGE | INTRAVENOUS | Status: DC | PRN
Start: 2023-08-29 — End: 2023-08-29
  Administered 2023-08-29: 120 mg via INTRAVENOUS

## 2023-08-29 MED ORDER — PREDNISONE 10 MG (21) PO TBPK: ORAL_TABLET | ORAL | 0 refills | Status: AC

## 2023-08-29 MED ORDER — DEXAMETHASONE SODIUM PHOSPHATE 4 MG/ML IJ SOLN
INTRAMUSCULAR | Status: DC | PRN
  Administered 2023-08-29: 8 mg via INTRAVENOUS

## 2023-08-29 MED ORDER — ACETAMINOPHEN 10 MG/ML IV SOLN
15.0000 mg/kg | Freq: Once | INTRAVENOUS | Status: AC
  Administered 2023-08-29: 851 mg via INTRAVENOUS

## 2023-08-29 MED ORDER — MIDAZOLAM HCL 5 MG/5ML IJ SOLN
INTRAMUSCULAR | Status: DC | PRN
  Administered 2023-08-29: 2 mg via INTRAVENOUS

## 2023-08-29 MED ORDER — BUPIVACAINE HCL (PF) 0.25 % IJ SOLN
INTRAMUSCULAR | Status: DC | PRN
  Administered 2023-08-29: 2 mL

## 2023-08-29 MED ORDER — DEXAMETHASONE SODIUM PHOSPHATE 4 MG/ML IJ SOLN
INTRAMUSCULAR | Status: AC
  Filled 2023-08-29: qty 2

## 2023-08-29 MED ORDER — FENTANYL CITRATE (PF) 100 MCG/2ML IJ SOLN
INTRAMUSCULAR | Status: AC
Start: 2023-08-29 — End: ?
  Filled 2023-08-29: qty 2

## 2023-08-29 MED ORDER — SODIUM CHLORIDE 0.9 % IV SOLN
INTRAVENOUS | Status: DC | PRN
Start: 2023-08-29 — End: 2023-08-29

## 2023-08-29 MED ORDER — ACETAMINOPHEN 10 MG/ML IV SOLN
INTRAVENOUS | Status: AC
  Filled 2023-08-29: qty 100

## 2023-08-29 MED ORDER — ONDANSETRON HCL 4 MG PO TABS: 4.0000 mg | ORAL_TABLET | Freq: Three times a day (TID) | ORAL | 0 refills | Status: AC | PRN

## 2023-08-29 MED ORDER — OXYMETAZOLINE HCL 0.05 % NA SOLN
NASAL | Status: DC | PRN
  Administered 2023-08-29: 1

## 2023-08-29 MED ORDER — ONDANSETRON HCL 4 MG/2ML IJ SOLN
INTRAMUSCULAR | Status: DC | PRN
  Administered 2023-08-29: 4 mg via INTRAVENOUS

## 2023-08-29 MED ORDER — LIDOCAINE VISCOUS HCL 2 % MT SOLN: 10.0000 mL | Freq: Four times a day (QID) | OROMUCOSAL | 0 refills | Status: AC | PRN

## 2023-08-29 MED ORDER — PROPOFOL 10 MG/ML IV BOLUS
INTRAVENOUS | Status: DC | PRN
  Administered 2023-08-29: 150 mg via INTRAVENOUS

## 2023-08-29 MED ORDER — LACTATED RINGERS IV SOLN: INTRAVENOUS | Status: DC

## 2023-08-29 SURGICAL SUPPLY — 13 items
BLADE ELECT COATED/INSUL 125 (ELECTRODE) ×2 IMPLANT
CANISTER SUCT 1200ML W/VALVE (MISCELLANEOUS) ×2 IMPLANT
CATH ROBINSON RED A/P 10FR (CATHETERS) ×2 IMPLANT
COAGULATOR SUCTION 6 10FR HC (MISCELLANEOUS) ×2 IMPLANT
ELECTRODE REM PT RTRN 9FT ADLT (ELECTROSURGICAL) ×2 IMPLANT
GLOVE SURG GAMMEX PI TX LF 7.5 (GLOVE) ×2 IMPLANT
KIT TURNOVER KIT A (KITS) ×2 IMPLANT
PACK TONSIL AND ADENOID CUSTOM (PACKS) ×2 IMPLANT
PENCIL SMOKE EVACUATOR (MISCELLANEOUS) ×2 IMPLANT
SLEEVE SUCTION 125 (MISCELLANEOUS) ×2 IMPLANT
SOLUTION ANTFG W/FOAM PAD STRL (MISCELLANEOUS) ×2 IMPLANT
STRAP BODY AND KNEE 60X3 (MISCELLANEOUS) ×2 IMPLANT
SYR 5ML LL (SYRINGE) ×2 IMPLANT

## 2023-08-29 NOTE — H&P (Signed)
 ..  History and Physical paper copy reviewed and updated date of procedure and will be scanned into system.  Patient seen and examined.

## 2023-08-29 NOTE — Transfer of Care (Signed)
 Immediate Anesthesia Transfer of Care Note  Patient: Vicki Black  Procedure(s) Performed: TONSILLECTOMY (Bilateral: Mouth)  Patient Location: PACU  Anesthesia Type: General ETT  Level of Consciousness: awake, alert  and patient cooperative  Airway and Oxygen Therapy: Patient Spontanous Breathing and Patient connected to supplemental oxygen  Post-op Assessment: Post-op Vital signs reviewed, Patient's Cardiovascular Status Stable, Respiratory Function Stable, Patent Airway and No signs of Nausea or vomiting  Post-op Vital Signs: Reviewed and stable  Complications: No notable events documented.

## 2023-08-29 NOTE — Anesthesia Preprocedure Evaluation (Addendum)
 Anesthesia Evaluation  Patient identified by MRN, date of birth, ID band Patient awake    Reviewed: Allergy & Precautions, H&P , NPO status , Patient's Chart, lab work & pertinent test results  Airway Mallampati: I  TM Distance: >3 FB Neck ROM: Full    Dental no notable dental hx.    Pulmonary former smoker   Pulmonary exam normal breath sounds clear to auscultation       Cardiovascular negative cardio ROS Normal cardiovascular exam Rhythm:Regular Rate:Normal     Neuro/Psych  PSYCHIATRIC DISORDERS Anxiety Depression Bipolar Disorder   negative neurological ROS  negative psych ROS   GI/Hepatic negative GI ROS, Neg liver ROS,,,  Endo/Other  negative endocrine ROS    Renal/GU negative Renal ROS  negative genitourinary   Musculoskeletal negative musculoskeletal ROS (+)    Abdominal   Peds negative pediatric ROS (+)  Hematology negative hematology ROS (+)   Anesthesia Other Findings Bursitis of hip  Back pain Anxiety  Bipolar 1 disorder (HCC)  HCG negative today States she "fought the anesthesia really hard" when had tubal ligation and is very anxious.  Requests anxiolytic now, so will start will versed 2 mg IV now  Reproductive/Obstetrics negative OB ROS                             Anesthesia Physical Anesthesia Plan  ASA: 2  Anesthesia Plan: General ETT   Post-op Pain Management:    Induction: Intravenous  PONV Risk Score and Plan:   Airway Management Planned: Oral ETT  Additional Equipment:   Intra-op Plan:   Post-operative Plan: Extubation in OR  Informed Consent: I have reviewed the patients History and Physical, chart, labs and discussed the procedure including the risks, benefits and alternatives for the proposed anesthesia with the patient or authorized representative who has indicated his/her understanding and acceptance.     Dental Advisory Given  Plan  Discussed with: Anesthesiologist, CRNA and Surgeon  Anesthesia Plan Comments: (Patient consented for risks of anesthesia including but not limited to:  - adverse reactions to medications - damage to eyes, teeth, lips or other oral mucosa - nerve damage due to positioning  - sore throat or hoarseness - Damage to heart, brain, nerves, lungs, other parts of body or loss of life  Patient voiced understanding and assent.)        Anesthesia Quick Evaluation

## 2023-08-29 NOTE — Anesthesia Postprocedure Evaluation (Signed)
 Anesthesia Post Note  Patient: Water quality scientist  Procedure(s) Performed: TONSILLECTOMY (Bilateral: Mouth)  Patient location during evaluation: PACU Anesthesia Type: General Level of consciousness: awake and alert Pain management: pain level controlled Vital Signs Assessment: post-procedure vital signs reviewed and stable Respiratory status: spontaneous breathing, nonlabored ventilation, respiratory function stable and patient connected to nasal cannula oxygen Cardiovascular status: blood pressure returned to baseline and stable Postop Assessment: no apparent nausea or vomiting Anesthetic complications: no   No notable events documented.   Last Vitals:  Vitals:   08/29/23 1040 08/29/23 1044  BP:  130/76  Pulse: 90 90  Resp: 14 14  Temp:  (!) 36.3 C  SpO2: 96% 98%    Last Pain:  Vitals:   08/29/23 1044  TempSrc:   PainSc: 8                  Daemion Mcniel C Moises Terpstra

## 2023-08-29 NOTE — Op Note (Signed)
..  08/29/2023  9:56 AM    Vicki Black  [id number]   Pre-Op Dx:  Tonsillitis, chronic [J35.01]  Post-op Dx: Tonsillitis, chronic [J35.01]  Proc:Tonsillectomy > age 34  Surg: Vicki Black  Anes:  General Endotracheal  EBL:  <13ml  Comp:  None  Findings:  3+ cryptic and edematous and erythematous tonsils with exudate  Procedure: After the patient was identified in holding and the history and physical and consent was reviewed, the patient was taken to the operating room and placed in a supine position.  General endotracheal anesthesia was induced in the normal fashion.  At this time, the patient was rotated 45 degrees and a shoulder roll was placed.  At this time, a McIvor mouthgag was inserted into the patient's oral cavity and suspended from the Mayo stand without injury to teeth, lips, or gums.  Next a red rubber catheter was inserted into the patient left nostril for retraction of the uvula and soft palate superiorly.  Next a curved Alice clamp was attached to the patient's right superior tonsillar pole and retracted medially and inferiorly.  A Bovie electrocautery was used to dissect the patient's right tonsil in a subcapsular plane.  Meticulous hemostasis was achieved with Bovie suction cautery.  At this time, the mouth gag was released from suspension for 1 minute.  Attention now was directed to the patient's left side.  In a similar fashion the curved Alice clamp was attached to the superior pole and this was retracted medially and inferiorly and the tonsil was excised in a subcapsular plane with Bovie electrocautery.  After completion of the second tonsil, meticulous hemostasis was continued.  At this time, the patient's nasal cavity and oral cavity was irrigated with sterile saline.  2ml of 0.25% Marcaine was injected into the anterior and posterior tonsillar fossa bilaterally.  Following this  The care of patient was returned to anesthesia, awakened, and transferred to  recovery in stable condition.  Dispo:  PACU to home  Plan: Soft diet.  Limit exercise and strenuous activity for 2 weeks.  Fluid hydration  Recheck my office three weeks.   Vicki Black Vicki Black 9:56 AM 08/29/2023

## 2023-08-30 LAB — SURGICAL PATHOLOGY

## 2023-09-02 ENCOUNTER — Encounter: Payer: Self-pay | Admitting: Otolaryngology

## 2023-11-26 ENCOUNTER — Other Ambulatory Visit: Payer: Self-pay

## 2023-11-26 ENCOUNTER — Emergency Department: Payer: MEDICAID

## 2023-11-26 ENCOUNTER — Emergency Department
Admission: EM | Admit: 2023-11-26 | Discharge: 2023-11-26 | Discharge status: Against Medical Advice | Payer: MEDICAID | Attending: Emergency Medicine | Admitting: Emergency Medicine

## 2023-11-26 DIAGNOSIS — Z5321 Procedure and treatment not carried out due to patient leaving prior to being seen by health care provider: Secondary | ICD-10-CM | POA: Insufficient documentation

## 2023-11-26 DIAGNOSIS — S99921A Unspecified injury of right foot, initial encounter: Secondary | ICD-10-CM | POA: Diagnosis present

## 2023-11-26 DIAGNOSIS — W1842XA Slipping, tripping and stumbling without falling due to stepping into hole or opening, initial encounter: Secondary | ICD-10-CM | POA: Insufficient documentation

## 2023-11-26 NOTE — Progress Notes (Addendum)
 Subjective Patient ID: Vicki Black is a 33 y.o. female.    33 year old female presents to clinic complaining of right ankle and right foot pain after stepping in a hole last night.  Patient states that she twisted her ankle and is having constant achy pain that she rates at 10 out of 10.  She has taken over-the-counter medications and iced the area prior to the clinic visit and continues to have pain.  She states when she walks on the foot and ankle she feels a pop in her right ankle.  She denies any other injuries and complaints on today's visit.   History provided by:  Patient   Review of Systems  Constitutional:  Negative for chills, fatigue and fever.  Gastrointestinal:  Negative for abdominal pain, diarrhea, nausea and vomiting.  Musculoskeletal:  Negative for back pain, neck pain and neck stiffness.       Right ankle pain Right foot pain  Skin:  Negative for color change, pallor, rash and wound.  Neurological:  Negative for weakness, numbness and headaches.    Patient History  Allergies: Allergies  Allergen Reactions  . Hydrocodone -Acetaminophen  Other    Causes head to feel funny  . Sulfa Antibiotics Nausea And Vomiting and Nausea Only    History reviewed. No pertinent past medical history. History reviewed. No pertinent surgical history. Social History   Socioeconomic History  . Marital status: Single    Spouse name: Not on file  . Number of children: Not on file  . Years of education: Not on file  . Highest education level: Not on file  Occupational History  . Not on file  Tobacco Use  . Smoking status: Never  . Smokeless tobacco: Current  Vaping Use  . Vaping status: Every Day  Substance and Sexual Activity  . Alcohol use: Never  . Drug use: Never  . Sexual activity: Defer  Other Topics Concern  . Not on file  Social History Narrative  . Not on file   History reviewed. No pertinent family history. Current Outpatient Medications on File Prior to  Visit  Medication Sig Dispense Refill  . [DISCONTINUED] amphetamine-dextroamphetamine (Adderall) 15 MG tablet     . [DISCONTINUED] benzonatate  (Tessalon  Perles) 100 MG capsule T 1-2 tab po tid (Patient not taking: Reported on 01/29/2023) 30 capsule 0  . [DISCONTINUED] cyanocobalamin (Vitamin B-12) 1000 MCG tablet Take 1,000 mcg by mouth 1 (one) time each day.    . [DISCONTINUED] cyclobenzaprine (Flexeril) 10 MG tablet Take 1 tablet (10 mg total) by mouth 3 (three) times a day if needed for muscle spasms for up to 10 days. 30 tablet 0  . [DISCONTINUED] diazePAM (Valium) 5 MG tablet TAKE ONE TABLET BY MOUTH UP TO THREE TIMES DAILY AS DIRECTED    . [DISCONTINUED] hydrOXYzine  HCl (Atarax ) 10 MG tablet T 1-3 tab po q 4-6 hr prn (Patient not taking: Reported on 01/29/2023) 30 tablet 0  . [DISCONTINUED] lamoTRIgine  (LaMICtal ) 200 MG tablet Take 1 tablet by mouth 1 (one) time each day.    . [DISCONTINUED] ondansetron  ODT (Zofran -ODT) 4 MG disintegrating tablet Take 1-2 tablets (4-8 mg total) by mouth every 8 (eight) hours if needed for nausea or vomiting. 20 tablet 0  . [DISCONTINUED] tiZANidine (Zanaflex) 4 MG tablet Take 1 tablet (4 mg total) by mouth every 6 (six) hours if needed for muscle spasms for up to 10 days. 30 tablet 0   No current facility-administered medications on file prior to visit.    Objective  Vitals:   11/26/23 0857  BP: 111/81  Pulse: (!) 113  Resp: 18  Temp: 36.9 C (98.4 F)  SpO2: 98%  Weight: 53.1 kg  Height: 5' 5  PainSc: 10-Worst pain ever  LMP: 11/17/2023        OBGYN/Pregnancy Status: Having periods   Physical Exam  GENERAL APPEARANCE: afebrile, non-toxic and not acutely ill-appearing in NAD. Patient is speaking in full sentences with no increased work of breathing. EYES: EOM intact, PERRLA, with no conjunctival injection noted. HEART: RRR, S1 and S2 present with no obvious murmur noted. No rubs, gallops or clicks noted. LUNGS: CTA bilaterally with no  wheezing, ronchi, rales or crackles noted.  SKIN: Warm, dry with no obvious rash present. Peripheral pulses are present and bounding. Adequate peripheral capillary refill is noted. MSK: Moderate tenderness and swelling noted to the lateral aspect of the right ankle.  Mild tenderness and swelling noted to the medial aspect of the right ankle.  Range of motion of the right ankle is significantly limited due to pain and swelling.  Mild tenderness noted to the mid right foot.  Range of motion of the foot appears to be intact with no significant pain noted NEURO: Alert and oriented x 3. No motor or sensory deficits noted to the right lower leg.   Results for orders placed or performed in visit on 11/26/23  XR ankle 3+ views right   Narrative   Exam X-Ray   Views of the  r Ankle  Comparison: None provided.    Findings Soft tissue swelling right ankle  There is no fracture or other acute osseous abnormality.   Alignment of the ankle mortise is normal.   Unfused ossicle posterior superior navicular    Impression   1. Soft tissue swelling right ankle 2. Unfused ossicle posterior superior navicular  Electronically signed by: Ozell MICAEL Potters, M. D. on 11/26/2023 10:17:02  XR foot 3+ views right   Narrative   Exam X-Ray   Views of the  r Foot  Comparison None provided.    Findings There is no soft tissue swelling identified.   There is no clear radiographic evidence for acute fracture on these views.   There is no radio opaque foreign body appreciated.   No significant degenerative changes are identified.   Unfused ossicle posterior superior navicular, correlate clinically    Impression   Unfused ossicle posterior superior navicular, correlate clinically  Electronically signed by: Ozell MICAEL Potters, M. D. on 11/26/2023 10:18:04      Procedures MDM:     1 Acute complicated illness or injury     Explanation of Medical Decision Making and variances from expected care:     Advised to rest, ice and use Tylenol  for pain. Meds as ordered. X-rays discussed with the patient. Advised to use walker boot and crutches until pain has resolved. If pain persist or worsens follow up with [facility] clinic. See PCP soon. F/U as needed.    Assessment requiring historian other than patient: No     Independent visualization of image, tracing, or test: No     Discussion of management with another provider: No     Risk:: Moderate         Assessment/Plan Diagnoses and all orders for this visit:  Moderate right ankle sprain, initial encounter -     ketorolac  (Toradol ) injection 30 mg -     ketorolac  (Toradol ) 10 MG tablet; Take 1 tablet (10 mg total) by mouth every 8 (eight) hours  if needed for moderate pain. -     Crutches - All sizes  Right foot pain -     XR foot 3+ views right  Acute right ankle pain -     XR ankle 3+ views right    Disposition Status: Home  Patient Instructions  Advised the patient to rest, ice and use OTC meds for pain. X-rays discussed with the patient.  Walker boot and crutches provided to the patient.  Advised if pain persist or worsens to follow up with [facility] clinic. F/U with PCP in 2-3 days for re-check  F/U as needed.  [facility] [address] [place], [place] [address]  [phone]   Progress note signed by Fairy Jenny, PA on 11/26/23 at 11:21 AM

## 2023-11-26 NOTE — ED Notes (Addendum)
 Xray tech Shara st pt declines xray at this time and st she is leaving

## 2023-11-26 NOTE — ED Triage Notes (Signed)
 Pt comes with right foot injury. Pt states she stepped in hole and  injured it thrombocytopenia went to [facility] and had xray done. Pt states the bruising is coming up her ankle now and her foot feels cold and numb. Pt states she loosen the ace bandage but it is still there.

## 2024-05-29 ENCOUNTER — Ambulatory Visit: Payer: MEDICAID | Admitting: Obstetrics
